# Patient Record
Sex: Female | Born: 1965 | Race: Black or African American | Hispanic: No | Marital: Single | Smoking: Never smoker
Health system: Southern US, Community
[De-identification: ages and names within clinical notes are randomized; demographics above are authoritative.]

## PROBLEM LIST (undated history)

## (undated) DIAGNOSIS — R569 Unspecified convulsions: Secondary | ICD-10-CM

## (undated) DIAGNOSIS — I10 Essential (primary) hypertension: Secondary | ICD-10-CM

## (undated) DIAGNOSIS — F419 Anxiety disorder, unspecified: Secondary | ICD-10-CM

## (undated) DIAGNOSIS — J302 Other seasonal allergic rhinitis: Secondary | ICD-10-CM

---

## 2001-10-12 ENCOUNTER — Emergency Department (HOSPITAL_COMMUNITY): Admission: EM | Admit: 2001-10-12 | Discharge: 2001-10-13 | Payer: Self-pay | Admitting: *Deleted

## 2002-01-01 ENCOUNTER — Ambulatory Visit (HOSPITAL_COMMUNITY): Admission: RE | Admit: 2002-01-01 | Discharge: 2002-01-01 | Payer: Self-pay | Admitting: Family Medicine

## 2002-01-01 ENCOUNTER — Encounter: Payer: Self-pay | Admitting: Family Medicine

## 2002-06-02 ENCOUNTER — Encounter: Payer: Self-pay | Admitting: Family Medicine

## 2002-06-02 ENCOUNTER — Ambulatory Visit (HOSPITAL_COMMUNITY): Admission: RE | Admit: 2002-06-02 | Discharge: 2002-06-02 | Payer: Self-pay | Admitting: Family Medicine

## 2007-06-01 ENCOUNTER — Inpatient Hospital Stay (HOSPITAL_COMMUNITY): Admission: EM | Admit: 2007-06-01 | Discharge: 2007-06-07 | Payer: Self-pay | Admitting: Emergency Medicine

## 2008-04-04 HISTORY — PX: CHOLECYSTECTOMY: SHX55

## 2008-04-13 ENCOUNTER — Ambulatory Visit (HOSPITAL_COMMUNITY): Admission: RE | Admit: 2008-04-13 | Discharge: 2008-04-13 | Payer: Self-pay | Admitting: Family Medicine

## 2008-04-14 ENCOUNTER — Inpatient Hospital Stay (HOSPITAL_COMMUNITY): Admission: AD | Admit: 2008-04-14 | Discharge: 2008-04-16 | Payer: Self-pay | Admitting: Family Medicine

## 2008-04-15 ENCOUNTER — Encounter (INDEPENDENT_AMBULATORY_CARE_PROVIDER_SITE_OTHER): Payer: Self-pay | Admitting: General Surgery

## 2009-04-07 ENCOUNTER — Emergency Department (HOSPITAL_COMMUNITY): Admission: EM | Admit: 2009-04-07 | Discharge: 2009-04-07 | Payer: Self-pay | Admitting: Emergency Medicine

## 2009-04-14 ENCOUNTER — Ambulatory Visit (HOSPITAL_COMMUNITY): Admission: RE | Admit: 2009-04-14 | Discharge: 2009-04-14 | Payer: Self-pay | Admitting: Family Medicine

## 2009-04-18 ENCOUNTER — Ambulatory Visit (HOSPITAL_COMMUNITY): Admission: RE | Admit: 2009-04-18 | Discharge: 2009-04-18 | Payer: Self-pay | Admitting: Family Medicine

## 2010-01-26 ENCOUNTER — Observation Stay (HOSPITAL_COMMUNITY): Admission: AC | Admit: 2010-01-26 | Discharge: 2010-01-27 | Payer: Self-pay | Source: Home / Self Care

## 2010-04-16 LAB — COMPREHENSIVE METABOLIC PANEL
ALT: 10 U/L (ref 0–35)
AST: 17 U/L (ref 0–37)
CO2: 26 meq/L (ref 19–32)
Calcium: 8.8 mg/dL (ref 8.4–10.5)
Creatinine, Ser: 1.06 mg/dL (ref 0.4–1.2)
GFR calc Af Amer: >60 mL/min (ref >60)
GFR calc non Af Amer: 57 mL/min — ABNORMAL LOW (ref >60)
Potassium: 3.5 meq/L (ref 3.5–5.1)
Total Bilirubin: 0.6 mg/dL (ref 0.3–1.2)

## 2010-04-16 LAB — POCT I-STAT, CHEM 8
Calcium, Ion: 0.97 mmol/L — ABNORMAL LOW (ref 1.12–1.32)
Chloride: 104 meq/L (ref 96–112)
Glucose, Bld: 104 mg/dL — ABNORMAL HIGH (ref 70–99)
HCT: 41 % (ref 36.0–46.0)
Potassium: 3.7 meq/L (ref 3.5–5.1)
Sodium: 138 meq/L (ref 135–145)

## 2010-04-16 LAB — CBC
Hemoglobin: 12.6 g/dL (ref 12.0–15.0)
MCHC: 34.1 g/dL (ref 30.0–36.0)
MCV: 87.1 fL (ref 78.0–100.0)
Platelets: 343 10*3/uL (ref 150–400)
Platelets: 370 10*3/uL (ref 150–400)
RBC: 4.56 MIL/uL (ref 3.87–5.11)
RDW: 13.5 % (ref 11.5–15.5)
WBC: 10 10*3/uL (ref 4.0–10.5)
WBC: 14 10*3/uL — ABNORMAL HIGH (ref 4.0–10.5)

## 2010-04-16 LAB — URINE MICROSCOPIC-ADD ON

## 2010-04-16 LAB — URINALYSIS, ROUTINE W REFLEX MICROSCOPIC
Nitrite: NEGATIVE
Protein, ur: NEGATIVE mg/dL

## 2010-04-16 LAB — BASIC METABOLIC PANEL
Calcium: 8.6 mg/dL (ref 8.4–10.5)
GFR calc Af Amer: >60 mL/min (ref >60)
GFR calc non Af Amer: >60 mL/min (ref >60)
Potassium: 3.9 mEq/L (ref 3.5–5.1)

## 2010-04-16 LAB — SAMPLE TO BLOOD BANK

## 2010-04-16 LAB — POCT CARDIAC MARKERS
Myoglobin, poc: 290 ng/mL (ref 12–200)
Troponin i, poc: <0.05 ng/mL (ref 0.00–0.09)

## 2010-04-29 LAB — URINALYSIS, ROUTINE W REFLEX MICROSCOPIC
Bilirubin Urine: NEGATIVE
Glucose, UA: NEGATIVE mg/dL
Hgb urine dipstick: NEGATIVE
Ketones, ur: NEGATIVE mg/dL
Nitrite: NEGATIVE
Protein, ur: NEGATIVE mg/dL
Specific Gravity, Urine: 1.015 (ref 1.005–1.030)
Urobilinogen, UA: 0.2 mg/dL (ref 0.0–1.0)
pH: 6 (ref 5.0–8.0)

## 2010-04-29 LAB — DIFFERENTIAL
Basophils Absolute: 0 10*3/uL (ref 0.0–0.1)
Basophils Relative: 0 % (ref 0–1)
Eosinophils Absolute: 0 10*3/uL (ref 0.0–0.7)
Eosinophils Relative: 0 % (ref 0–5)
Lymphocytes Relative: 22 % (ref 12–46)
Lymphs Abs: 2.2 K/uL (ref 0.7–4.0)
Monocytes Absolute: 0.8 10*3/uL (ref 0.1–1.0)
Monocytes Relative: 8 % (ref 3–12)
Neutro Abs: 6.8 K/uL (ref 1.7–7.7)
Neutrophils Relative %: 69 % (ref 43–77)

## 2010-04-29 LAB — BASIC METABOLIC PANEL
BUN: 9 mg/dL (ref 6–23)
CO2: 32 mEq/L (ref 19–32)
Calcium: 9.3 mg/dL (ref 8.4–10.5)
Chloride: 99 mEq/L (ref 96–112)
Creatinine, Ser: 0.89 mg/dL (ref 0.4–1.2)
GFR calc non Af Amer: >60 mL/min (ref >60)
Glucose, Bld: 106 mg/dL — ABNORMAL HIGH (ref 70–99)
Potassium: 3.2 mEq/L — ABNORMAL LOW (ref 3.5–5.1)
Sodium: 137 mEq/L (ref 135–145)

## 2010-04-29 LAB — RAPID URINE DRUG SCREEN, HOSP PERFORMED
Amphetamines: NOT DETECTED
Barbiturates: NOT DETECTED
Benzodiazepines: NOT DETECTED
Cocaine: NOT DETECTED
Opiates: NOT DETECTED
Tetrahydrocannabinol: NOT DETECTED

## 2010-04-29 LAB — CBC
HCT: 40.2 % (ref 36.0–46.0)
Hemoglobin: 13.9 g/dL (ref 12.0–15.0)
MCHC: 34.7 g/dL (ref 30.0–36.0)
MCV: 90.3 fL (ref 78.0–100.0)
Platelets: 437 K/uL — ABNORMAL HIGH (ref 150–400)
RBC: 4.45 MIL/uL (ref 3.87–5.11)
RDW: 14 % (ref 11.5–15.5)
WBC: 9.9 10*3/uL (ref 4.0–10.5)

## 2010-04-29 LAB — BASIC METABOLIC PANEL WITH GFR: GFR calc Af Amer: >60 mL/min (ref >60)

## 2010-04-29 LAB — PREGNANCY, URINE: Preg Test, Ur: NEGATIVE

## 2010-05-17 LAB — BASIC METABOLIC PANEL
CO2: 27 mEq/L (ref 19–32)
CO2: 27 mEq/L (ref 19–32)
Calcium: 9 mg/dL (ref 8.4–10.5)
Chloride: 107 mEq/L (ref 96–112)
Creatinine, Ser: 1.18 mg/dL (ref 0.4–1.2)
GFR calc Af Amer: >60 mL/min (ref >60)
GFR calc Af Amer: >60 mL/min (ref >60)
GFR calc non Af Amer: 50 mL/min — ABNORMAL LOW (ref >60)
Glucose, Bld: 90 mg/dL (ref 70–99)
Potassium: 3.7 mEq/L (ref 3.5–5.1)
Sodium: 135 mEq/L (ref 135–145)
Sodium: 138 mEq/L (ref 135–145)

## 2010-05-17 LAB — DIFFERENTIAL
Basophils Absolute: 0 10*3/uL (ref 0.0–0.1)
Basophils Relative: 0 % (ref 0–1)
Basophils Relative: 1 % (ref 0–1)
Eosinophils Absolute: 0.1 10*3/uL (ref 0.0–0.7)
Eosinophils Relative: 2 % (ref 0–5)
Lymphocytes Relative: 20 % (ref 12–46)
Monocytes Absolute: 0.5 10*3/uL (ref 0.1–1.0)
Monocytes Absolute: 0.8 10*3/uL (ref 0.1–1.0)
Monocytes Relative: 15 % — ABNORMAL HIGH (ref 3–12)
Monocytes Relative: 9 % (ref 3–12)
Neutro Abs: 2.6 10*3/uL (ref 1.7–7.7)
Neutro Abs: 4.2 10*3/uL (ref 1.7–7.7)
Neutrophils Relative %: 70 % (ref 43–77)

## 2010-05-17 LAB — CBC
HCT: 35.8 % — ABNORMAL LOW (ref 36.0–46.0)
Hemoglobin: 12.4 g/dL (ref 12.0–15.0)
Hemoglobin: 14.1 g/dL (ref 12.0–15.0)
MCHC: 34.5 g/dL (ref 30.0–36.0)
MCHC: 34.6 g/dL (ref 30.0–36.0)
MCV: 89.5 fL (ref 78.0–100.0)
RBC: 4 MIL/uL (ref 3.87–5.11)
RBC: 4.55 MIL/uL (ref 3.87–5.11)
RDW: 14.2 % (ref 11.5–15.5)
RDW: 14.3 % (ref 11.5–15.5)

## 2010-05-17 LAB — HEPATIC FUNCTION PANEL
ALT: 14 U/L (ref 0–35)
AST: 16 U/L (ref 0–37)
Albumin: 3.6 g/dL (ref 3.5–5.2)

## 2010-06-19 NOTE — Op Note (Signed)
NAMEBEREA, MAJKOWSKI               ACCOUNT NO.:  [id number]   MEDICAL RECORD NO.:  [id number]          PATIENT TYPE:  INP   LOCATION:  A319                          FACILITY:  [facility]   PHYSICIAN:  Tilford Pillar, MD      DATE OF BIRTH:  11-22-1965   DATE OF PROCEDURE:  04/15/2008  DATE OF DISCHARGE:                               OPERATIVE REPORT   PREOPERATIVE DIAGNOSES:  1. Acute cholecystitis.  2. Cholelithiasis.   POSTOPERATIVE DIAGNOSES:  1. Acute cholecystitis.  2. Cholelithiasis.   PROCEDURE:  Laparoscopic cholecystectomy.   SURGEON:  Tilford Pillar, MD   ANESTHESIA:  General endotracheal local anesthetic 0.5% Sensorcaine  plain.   SPECIMENS:  Gallbladder.   ESTIMATED BLOOD LOSS:  Minimal.   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  Her workup was consistent with cholelithiasis.  The  risks, benefits, and alternatives of the laparoscopic possible open  cholecystectomy were discussed with the patient including, but not  limited to the risk of bleeding, infection, bile leak, small bowel  injury, common bile duct injury as well as possibility of intraoperative  cardiac and pulmonary events.  Additionally, further etiology of the  epigastric and right upper quadrant abdominal pain were discussed with  the patient and it was instructed to the patient that should she  continue to have symptomatology following cholecystectomy that  additional dyspeptic workup would likely be required.  The patient's  questions and concerns were addressed and the patient consented for the  planned procedure.   OPERATION:  The patient was taken to the operating room, she was placed  in supine position on the operating room table, at which time the  general anesthetic was administered.  Once the patient was asleep, she  was endotracheally intubated by Anesthesia.  At this time, her  abdomen  was prepped and draped with DuraPrep solution and draped in standard  fashion.  A stab incision was created supraumbilically with a #11 blade  scalpel.  Additional dissection down through the subcuticular tissue was  carried out using a Kocher clamp, which was utilized to grasp the  anterior abdominal wall fascia __________ this anteriorly.  A Veress  needle was inserted.  Saline drop test was utilized to confirm  intraperitoneal placement and then pneumoperitoneum was initiated.  Once  the patient's pneumoperitoneum was obtained, an 11-mm trocar was  inserted over the laparoscope allowing visualization of the trocar  entering into the peritoneal cavity.  At this time, the inner cannula  was removed.  The laparoscope was reinserted.  There was no evidence of  any trocar or Veress needle placement injury.  At this time, the  remaining trocars were placed using an 11-mm trocar was placed in the  epigastrium, a 5-mm trocar was placed in the midline between the two 11-  mm trocars and the 5-mm trocars in the right lateral abdominal wall.  The patient was placed in reversed Trendelenburg left lateral decubitus  position.  The fundus of the gallbladder was grasped  and lifted up and  over the right lobe of the liver.  Blunt dissection was carried out,  stripped the peritoneal reflection off the infundibulum, exposing a  cystic duct as it entered into the infundibulum.  A window was created  behind the cystic duct.  Three EndoClips were placed proximally, one  distally, and the cystic duct was divided between the two most distal  clips.  Similarly, the cystic artery was identified.  Two EndoClips were  placed proximally and one distally.  The cystic artery was divided  between the two most distal clips.  At this time, electrocautery was  utilized to dissect the gallbladder free from the gallbladder fossa.  During this dissection, a small cholecystotomy was created, allowing  some bile  spillage.  No stones were noted in the spillage.  This was  quickly controlled.  Once the gallbladder was free, it was placed into  an EndoCatch bag.  At this time, a suction irrigator was brought to the  field.  We copiously irrigated the surgical field.  The returning  aspirate was clear.  At this time, the gallbladder fossa was then  evaluated.  Hemostasis was excellent, having been controlled with  electrocautery.  The EndoClips noted to be in excellent position.  At  this time, attention was turned to closure.   Using an Endoclose suture passing device, a 2-0 Vicryl suture was passed  through both the 11-mm trocar sites.  With the sutures in place, the  gallbladder was removed through the epigastric trocar site.  Some sharp  and blunt dilatation was required to enlarge the trocar site adequately  to remove the gallbladder.  The gallbladder was removed and intact in  EndoCatch bag and was as a permanent specimen to pathology.  At this  time, the pneumoperitoneum was evacuated.  The Vicryl sutures were  secured.  The local anesthetic was instilled.  A 4-0 Monocryl was  utilized to reapproximate the skin edges at all 4 trocar sites.  The  skin was washed, dried with moist and dry towel.  Benzoin was applied  around the incision.  Half-inch Steri-Strips were placed.  The drapes  were removed.  The patient was allowed to come out of general anesthetic  and the patient was transferred back to regular hospital bed.  She was  transferred to the postanesthetic care unit in a stable condition.  At  the conclusion of procedure, all instrument, sponge, and needle counts  were correct.  The patient tolerated the procedure well.      Tilford Pillar, MD  Electronically Signed     BZ/MEDQ  D:  04/15/2008  T:  04/16/2008  Job:  [id number]   cc:   Lorin Picket A. Gerda Diss, MD  Fax: [phone]

## 2010-06-19 NOTE — H&P (Signed)
NAMEKANEISHA, ELLENBERGER               ACCOUNT NO.:  [id number]   MEDICAL RECORD NO.:  [id number]          PATIENT TYPE:  INP   LOCATION:  A310                          FACILITY:  [facility]   PHYSICIAN:  Scott A. Gerda Diss, MD    DATE OF BIRTH:  December 29, 1965   DATE OF ADMISSION:  06/01/2007  DATE OF DISCHARGE:  LH                              HISTORY & PHYSICAL   CHIEF COMPLAINT:  Syncope and cellulitis with abscess.   HISTORY OF PRESENT ILLNESS:  A 45 year old female was seen in the office  with a significant size cellulitis and abscess in the right buttock.  She was treated with doxycycline and Percocet.  On the way out of the  office walking down the hallway, she states she started feeling pain and  a eyewitness states that she just fell over and passed out.  She was  only out for a few seconds.  She landed on her side.  Witness could not  verify whether or not she struck her head.  She was assessed on the  scene by myself and she was alert and oriented when I found her.  She  was slightly clammy, but otherwise she states she felt okay.  She denied  any headache or neck pain.  EMS was summoned.  As a precaution they  placed her on a board with a neck collar and  brought her to the  emergency department.  She denies any prior trouble with this.   PAST MEDICAL HISTORY:  Negative for cardiac problems.  Positive for  hypertension.   PAST SURGICAL HISTORY:  No surgical history.   HABITS:  Non-drinker and non-smoker.   REVIEW OF SYSTEMS:  Negative for headache.  Denies chest pain, shortness  of breath.  No nausea or vomiting or diarrhea.  Denied fever.  Did state  she  had some chills yesterday,  but none today.  Denied any neurologic  symptoms.  No numbness or tingling, no palpitations.   LABORATORY:  Cardiac enzymes were negative.  Sodium 133, potassium 3.5,  creatinine 1.12.  WBC 19.6 with a left shift.   PHYSICAL EXAMINATION:  VITAL SIGNS:  Temperature 98.7 blood pressure  106/60, heart  rate 78.  O2 sat good.  HEENT:  TMs and LT __________.  NECK:  Supple  EXTREMITIES:  Strength normal.  LUNGS:  Clear.  HEART:  Regular.  ABDOMEN:  Soft.  SKIN:  Large buttock abscess noted with cellulitis.  Slight drainage  noted.  A/P cellulitis with abscess.   PLAN:  1. Will treat with vancomycin.  Will culture.  Also blood culture.  2. Will cover for the possibility of MRSA.  This is concerning and      worrisome for the possibility of abscess that would need surgical      drainage.  Will have Dr. Lovell Sheehan here.  3. It is felt that this issue of her passing out was secondary to      vasovagal related to the discomfort of the problem.  It is felt      that it is best for the patient to be  admitted in order to properly      treat this cellulitis, get it drained plus also alleviate some of      pain or alleviate the possibility of a syncope __________ and have      the patient watched in a hospital setting overnight, possibly go      home tomorrow or the next day depending on her progress.  Repeat      CBC in the morning.  Dr. Lovell Sheehan to see this evening.      Scott A. Gerda Diss, MD  Electronically Signed     SAL/MEDQ  D:  06/01/2007  T:  06/02/2007  Job:  [id number]

## 2010-06-19 NOTE — Op Note (Signed)
Valerie Elliott, Valerie Elliott               ACCOUNT NO.:  [id number]   MEDICAL RECORD NO.:  [id number]          PATIENT TYPE:  INP   LOCATION:  A310                          FACILITY:  [facility]   PHYSICIAN:  Dalia Heading, M.D.  DATE OF BIRTH:  11/17/1965   DATE OF PROCEDURE:  06/05/2007  DATE OF DISCHARGE:                               OPERATIVE REPORT   PREOPERATIVE DIAGNOSIS:  Perirectal abscess.   POSTOPERATIVE DIAGNOSIS:  Perirectal abscess.   PROCEDURE:  Incision and drainage of perirectal abscess.   SURGEON:  Dalia Heading, M.D.   ANESTHESIA:  General.   INDICATIONS:  The patient is a 45 year old black female who presents  with a draining perirectal abscess.  Despite local therapy and IV  antibiotics, the patient continues to have induration and drainage.  The  risks and benefits of the procedure were fully explained to the patient,  gave informed consent.   PROCEDURE NOTE:  The patient was placed in the lithotomy position after  general anesthesia was administered.  The perineum was prepped and  draped using the usual sterile technique with Betadine.  Surgical site  confirmation was performed.   A large indurated, draining abscess was noted at the 9 o'clock position  outside the anus.  An incision was made and the purulent fluid and  necrotic debris was evacuated without difficulty.  Curette was used to  get any necrotic soft tissue.  There was no connection to the rectum.  The wound was irrigated with normal saline.  Betadine-impregnated  packing was then placed into the wound.   All tape and needle counts were correct at the end of the procedure.  The patient was awakened and transferred to PACU in stable condition.   COMPLICATIONS:  None.   SPECIMEN:  None.   BLOOD LOSS:  Minimal.      Dalia Heading, M.D.  Electronically Signed     MAJ/MEDQ  D:  06/05/2007  T:  06/06/2007  Job:  [id number]   cc:   Lorin Picket A. Gerda Diss, MD  Fax: [phone]

## 2010-06-19 NOTE — H&P (Signed)
NAMEPETER, Valerie Elliott               ACCOUNT NO.:  [id number]   MEDICAL RECORD NO.:  [id number]          PATIENT TYPE:  INP   LOCATION:  A319                          FACILITY:  [facility]   PHYSICIAN:  Scott A. Gerda Diss, MD    DATE OF BIRTH:  1965/10/31   DATE OF ADMISSION:  04/14/2008  DATE OF DISCHARGE:  LH                              HISTORY & PHYSICAL   CHIEF COMPLAINT:  Abdominal pain, vomiting, diarrhea.   HISTORY OF PRESENT ILLNESS:  This is a 45 year old female who has had  approximately about 7-10 days discomfort in the right upper quadrant and  radiating to her side.  Some nausea with the pain comes and goes,  sometimes associated with food, sometimes not, and sometimes made to  feel quite nauseous.  The patient was recently seen in the office.  Lab  work and ultrasound were ordered and lab work came back looking good.  All CBC, met-7, liver profile, and lipase.  Ultrasound on March 10  showed gallstones without any obvious acute inflammation of gallbladder.  On the night of March 10 she started having intense pain in the right  upper quadrant along with several episodes of vomiting and also some  diarrhea.  She denied any bloody stool, denied dysuria. Urinary  frequency benign, fever, chills.  She came to the office today.  She  felt very bad and weak and felt somewhat dizzy when she tried to get up  __________.   PAST MEDICAL HISTORY:  HPN.   FAMILY HISTORY:  Diabetes __________ none..   SOCIAL HISTORY:  Does not smoke or drink.  Divorced.   MEDICATIONS:  Home medications:  Zestril, __________ daily, Hyzaar 1.25  mg, Vicodin p.r.n., Restoril p.r.n.,   REVIEW OF SYSTEMS:  See above.  HEENT:  __________ as above.  Mucous  membranes a little bit dry.  LUNGS:  Clear.  HEART:  Tachycardic.  __________.  ABDOMEN:  Soft with right upper quadrant tenderness.  No true guarding.  EXTREMITIES:  No peripheral edema.   LABORATORY DATA:  Met-7:  Potassium 3.8, sodium was good,  glucose 111,  creatinine 1.18.  Liver functions looked good.  White count normal.  Hemoglobin good.   Ultrasound today shows cholelithiasis with abdominal pain__________,  acute gastroenteritis with IV fluids.  Continue her pain medication.  After talking with the patient she states the last time she was in pain  management it made her itch.  She thinks it was Dilaudid.  We will try  to get her old records to look at them but in the meantime  she needs morphine if necessary.  In addition to this we will consult  Dr. Leticia Penna for the right upper quadrant tenderness at this time and  gallbladder tenderness secondary to gallstones.  He may want to obtain  __________ either now  or __________.  Surgical __________.      Scott A. Gerda Diss, MD  Electronically Signed     SAL/MEDQ  D:  04/14/2008  T:  04/15/2008  Job:  [id number]

## 2010-06-22 NOTE — Discharge Summary (Signed)
Valerie Elliott, Valerie Elliott               ACCOUNT NO.:  [id number]   MEDICAL RECORD NO.:  [id number]           PATIENT TYPE:   LOCATION:                                 FACILITY:   PHYSICIAN:  Scott A. Gerda Diss, MD    DATE OF BIRTH:  04/16/1960   DATE OF ADMISSION:  06/01/2007  DATE OF DISCHARGE:  05/03/2009LH                               DISCHARGE SUMMARY   DISCHARGE DIAGNOSES:  Severe cellulitis along with abscess, along with  syncope, and hypertension.   HOSPITAL COURSE:  This patient admitted with severe cellulitis of the  buttock, pain, discomfort, and swelling.  She was seen in the office,  was treated.  On leaving the office, she got very faint and passed out.  She has had significant pain and discomfort ever since.  She has had  progressive discomfort.  She was treated with sitz baths.  She was also  monitored closely on a daily basis, and this area finally developed into  an abscess, and Dr. Lovell Sheehan went ahead and took her to surgery and did a  drainage of this area.  When she came in, her white count was 19,000,  and by June 03, 2007, it came down to 11,000.  She also had low  potassium one day that got corrected and brought back up.  Wound culture  showed Staphylococcus aureus, and she was treated appropriately with  vancomycin.  She was sent home on doxycycline twice daily for 2 weeks,  and follow up with Dr. Lovell Sheehan within the next 2 weeks, and follow up  with [place] within the next 2 weeks.      Scott A. Gerda Diss, MD  Electronically Signed     SAL/MEDQ  D:  06/30/2007  T:  06/30/2007  Job:  [id number]

## 2010-06-22 NOTE — Discharge Summary (Signed)
[name], Valerie Elliott               ACCOUNT NO.:  [id number]   MEDICAL RECORD NO.:  [id number]          PATIENT TYPE:  INP   LOCATION:  A319                          FACILITY:  [facility]   PHYSICIAN:  Tilford Pillar, MD      DATE OF BIRTH:  1965/08/09   DATE OF ADMISSION:  04/14/2008  DATE OF DISCHARGE:  03/13/2010LH                               DISCHARGE SUMMARY   ADMISSION DIAGNOSIS:  Acute cholecystitis.   DISCHARGE DIAGNOSES:  Acute cholecystitis and hypertension.   PROCEDURE:  Laparoscopic cholecystectomy.   DISPOSITION:  Home.   BRIEF HISTORY AND PHYSICAL:  Please see the admission history and  physical for complete H and P.  The patient is a 45 year old female who  presented to [facility] with right upper quadrant abdominal  pain.  She was evaluated and worked up by Dr. Gerda Diss, at which time she  discovered that the patient to have cholelithiasis and biliary  symptomatology.  She was admitted for continued management and  intervention.   HOSPITAL COURSE:  The patient was admitted by Dr. Gerda Diss on April 14, 2008.  She was evaluated and was again diagnosed with an acute  cholecystitis, cholelithiasis.  She was taken to the operating room on  April 15, 2008, for laparoscopic cholecystectomy which she tolerated  extremely well.  She spent a brief period in the postanesthesia care  unit, was transferred back to a regular hospital floor and then was  allowed to advance back on her diet.  She was tolerating regular diet,  was ambulatory, and pain was controlled on more pain medications.  The  patient was discharged home on April 16, 2008.   DISCHARGE INSTRUCTIONS:  The patient was instructed to increase activity  as tolerated.  She may walk up stairs.  She may ambulate.  She is not to  lift anything greater than 20 pounds for the next 3 weeks.  She was  instructed to continue to clean her incisions with soap and water.  She  has no restriction on diet.  She is to return to  see me in followup in 3  weeks.  She is not to drive while taking pain medications.   DISCHARGE MEDICATIONS:  1. Lortab 5/500 one to two p.o. q.4 hours p.r.n. pain.  2. Ibuprofen 400 mg p.o. q.6 hours p.r.n. pain.      Tilford Pillar, MD  Electronically Signed    BZ/MEDQ  D:  05/18/2008  T:  05/19/2008  Job:  [id number]

## 2010-10-30 LAB — BASIC METABOLIC PANEL
BUN: 11
CO2: 28
CO2: 29
Calcium: 8.4
Chloride: 96
Creatinine, Ser: 1.12
GFR calc Af Amer: >60
GFR calc non Af Amer: 54 — ABNORMAL LOW
Glucose, Bld: 110 — ABNORMAL HIGH
Glucose, Bld: 112 — ABNORMAL HIGH
Potassium: 3.5
Sodium: 133 — ABNORMAL LOW
Sodium: 138

## 2010-10-30 LAB — CULTURE, BLOOD (ROUTINE X 2): Report Status: 5022009

## 2010-10-30 LAB — CBC
HCT: 38
Hemoglobin: 10.9 — ABNORMAL LOW
Hemoglobin: 12.1
MCHC: 35.1
MCHC: 35.4
MCV: 87.6
MCV: 87.8
Platelets: 411 — ABNORMAL HIGH
RBC: 3.9
RDW: 13.8
RDW: 14.1

## 2010-10-30 LAB — DIFFERENTIAL
Basophils Absolute: 0.1
Basophils Absolute: 0.1
Basophils Relative: 1
Basophils Relative: 1
Eosinophils Absolute: 0
Eosinophils Absolute: 0.1
Eosinophils Absolute: 0.1
Eosinophils Relative: 0
Eosinophils Relative: 1
Lymphocytes Relative: 8 — ABNORMAL LOW
Monocytes Absolute: 1.3 — ABNORMAL HIGH
Monocytes Absolute: 1.5 — ABNORMAL HIGH
Monocytes Relative: 10
Monocytes Relative: 8
Neutro Abs: 11.1 — ABNORMAL HIGH
Neutro Abs: 16.5 — ABNORMAL HIGH
Neutro Abs: 7.7
Neutrophils Relative %: 84 — ABNORMAL HIGH

## 2010-10-30 LAB — WOUND CULTURE

## 2010-10-30 LAB — POCT CARDIAC MARKERS
Operator id: 208401
Troponin i, poc: 0.06 — ABNORMAL HIGH

## 2010-10-30 LAB — POTASSIUM: Potassium: 3.8

## 2012-11-12 ENCOUNTER — Other Ambulatory Visit: Payer: Self-pay | Admitting: Family Medicine

## 2013-02-15 ENCOUNTER — Ambulatory Visit (INDEPENDENT_AMBULATORY_CARE_PROVIDER_SITE_OTHER): Payer: 59 | Admitting: Family Medicine

## 2013-02-15 ENCOUNTER — Encounter: Payer: Self-pay | Admitting: Family Medicine

## 2013-02-15 VITALS — BP 128/80 | Temp 99.3°F | Ht 70.0 in | Wt 232.0 lb

## 2013-02-15 DIAGNOSIS — J111 Influenza due to unidentified influenza virus with other respiratory manifestations: Secondary | ICD-10-CM

## 2013-02-15 DIAGNOSIS — J019 Acute sinusitis, unspecified: Secondary | ICD-10-CM

## 2013-02-15 MED ORDER — HYDROCODONE-ACETAMINOPHEN 5-325 MG PO TABS: 1.0000 | ORAL_TABLET | Freq: Four times a day (QID) | ORAL | Status: DC | PRN

## 2013-02-15 MED ORDER — SULFAMETHOXAZOLE-TMP DS 800-160 MG PO TABS
1.0000 | ORAL_TABLET | Freq: Two times a day (BID) | ORAL | Status: DC
Start: 2013-02-15 — End: 2013-03-08

## 2013-02-15 NOTE — Progress Notes (Signed)
   Subjective:    Patient ID: Valerie Elliott, female    DOB: 1965/06/20, 48 y.o.   MRN: [id number]  Fever  This is a new problem. The current episode started in the past 7 days. Her temperature was unmeasured prior to arrival. Associated symptoms include congestion, coughing, headaches and muscle aches. Pertinent negatives include no chest pain, ear pain or wheezing. She has tried NSAIDs (Mucinex, Nyquil) for the symptoms. The treatment provided mild relief.   PMH benign, HTN   Review of Systems  Constitutional: Positive for fever. Negative for activity change.  HENT: Positive for congestion and rhinorrhea. Negative for ear pain.   Eyes: Negative for discharge.  Respiratory: Positive for cough. Negative for shortness of breath and wheezing.   Cardiovascular: Negative for chest pain.  Neurological: Positive for headaches.       Objective:   Physical Exam  Nursing note and vitals reviewed. Constitutional: She appears well-developed.  HENT:  Head: Normocephalic.  Nose: Nose normal.  Mouth/Throat: Oropharynx is clear and moist. No oropharyngeal exudate.  Neck: Neck supple.  Cardiovascular: Normal rate and normal heart sounds.   No murmur heard. Pulmonary/Chest: Effort normal and breath sounds normal. She has no wheezes.  Lymphadenopathy:    She has no cervical adenopathy.  Skin: Skin is warm and dry.    She does not appear toxic but she does look like she feels bad. She is able to relate freely. Moderate sinus tenderness on percussion. Lungs were crystal clear.      Assessment & Plan:  Influenza with secondary sinusitis. At this stage in her illness Tamiflu would not have benefit. Influenza-the patient was diagnosed with influenza. Patient/family educated about the flu and warning signs to watch for. If difficulty breathing, severe neck pain and stiffness, cyanosis, disorientation, or progressive worsening then immediately get rechecked at that ER. If progressive symptoms be  certain to be rechecked. Supportive measures such as Tylenol/ibuprofen was discussed. No aspirin use in children. And influenza home care instruction sheet was given.

## 2013-03-08 ENCOUNTER — Encounter: Payer: Self-pay | Admitting: Family Medicine

## 2013-03-08 ENCOUNTER — Ambulatory Visit (INDEPENDENT_AMBULATORY_CARE_PROVIDER_SITE_OTHER): Payer: 59 | Admitting: Family Medicine

## 2013-03-08 ENCOUNTER — Telehealth: Payer: Self-pay | Admitting: Family Medicine

## 2013-03-08 VITALS — BP 122/70 | Temp 98.7°F | Ht 70.0 in | Wt 236.0 lb

## 2013-03-08 DIAGNOSIS — J040 Acute laryngitis: Secondary | ICD-10-CM

## 2013-03-08 DIAGNOSIS — J019 Acute sinusitis, unspecified: Secondary | ICD-10-CM

## 2013-03-08 DIAGNOSIS — J069 Acute upper respiratory infection, unspecified: Secondary | ICD-10-CM

## 2013-03-08 MED ORDER — AZITHROMYCIN 250 MG PO TABS: ORAL_TABLET | ORAL | Status: DC

## 2013-03-08 MED ORDER — BENZONATATE 100 MG PO CAPS: 100.0000 mg | ORAL_CAPSULE | Freq: Four times a day (QID) | ORAL | Status: DC | PRN

## 2013-03-08 MED ORDER — HYDROCODONE-HOMATROPINE 5-1.5 MG/5ML PO SYRP: 5.0000 mL | ORAL_SOLUTION | Freq: Three times a day (TID) | ORAL | Status: DC | PRN

## 2013-03-08 NOTE — Progress Notes (Signed)
   Subjective:    Patient ID: Valerie Elliott, female    DOB: 1965-11-05, 48 y.o.   MRN: [id number]  Cough This is a new problem. The current episode started yesterday. Associated symptoms include headaches, myalgias, rhinorrhea, a sore throat and wheezing. Pertinent negatives include no chest pain, ear pain, fever or shortness of breath.   PMH benign   Review of Systems  Constitutional: Negative for fever and activity change.  HENT: Positive for congestion, rhinorrhea and sore throat. Negative for ear pain.   Eyes: Negative for discharge.  Respiratory: Positive for cough and wheezing. Negative for shortness of breath.   Cardiovascular: Negative for chest pain.  Musculoskeletal: Positive for myalgias.  Neurological: Positive for headaches.       Objective:   Physical Exam  Nursing note and vitals reviewed. Constitutional: She appears well-developed.  HENT:  Head: Normocephalic.  Nose: Nose normal.  Mouth/Throat: Oropharynx is clear and moist. No oropharyngeal exudate.  Neck: Neck supple.  Cardiovascular: Normal rate and normal heart sounds.   No murmur heard. Pulmonary/Chest: Effort normal and breath sounds normal. She has no wheezes.  Lymphadenopathy:    She has no cervical adenopathy.  Skin: Skin is warm and dry.      Hoarseness is noted    Assessment & Plan:  Upper rest for illness/sinusitis/viral syndrome-Z-Pak as directed Hycodan at nighttime as needed for cough, cautioned drowsiness, Tessalon when necessary should gradually get better next few days

## 2013-03-08 NOTE — Telephone Encounter (Signed)
Patient is still having slight fever, extreme coughing, voice hoarse, chest pain   [facility]

## 2013-03-08 NOTE — Telephone Encounter (Signed)
Patient notified and transferred up front to make an appt for today

## 2013-03-08 NOTE — Telephone Encounter (Signed)
Spoke with patient. She said she is still coughing, has sinus drainage, hoarse voice, chills/sweats (but did not take temp.), and states she is wheezing. She was seen here on 1/12 for flu-like symptoms and was given Bactrim and Norco.

## 2013-03-08 NOTE — Telephone Encounter (Signed)
I believe this patient needs to be seen again. There is no fantastic answers right at the moment but it is better to be safe. We need to exclude pneumonia. She needs her lungs listened to. She will more than likely need antibiotics and potentially other measures. Needs office visit today

## 2013-03-21 ENCOUNTER — Other Ambulatory Visit: Payer: Self-pay | Admitting: Family Medicine

## 2013-05-10 ENCOUNTER — Encounter: Payer: Self-pay | Admitting: Family Medicine

## 2013-05-10 ENCOUNTER — Ambulatory Visit (INDEPENDENT_AMBULATORY_CARE_PROVIDER_SITE_OTHER): Payer: 59 | Admitting: Family Medicine

## 2013-05-10 VITALS — BP 118/86 | Ht 70.0 in | Wt 223.0 lb

## 2013-05-10 DIAGNOSIS — M5412 Radiculopathy, cervical region: Secondary | ICD-10-CM

## 2013-05-10 DIAGNOSIS — M5010 Cervical disc disorder with radiculopathy, unspecified cervical region: Secondary | ICD-10-CM

## 2013-05-10 MED ORDER — HYDROCODONE-ACETAMINOPHEN 7.5-325 MG PO TABS: 1.0000 | ORAL_TABLET | ORAL | Status: DC | PRN

## 2013-05-10 MED ORDER — CYCLOBENZAPRINE HCL 10 MG PO TABS: 10.0000 mg | ORAL_TABLET | Freq: Every day | ORAL | Status: DC

## 2013-05-10 MED ORDER — PREDNISONE 20 MG PO TABS: ORAL_TABLET | ORAL | Status: AC

## 2013-05-10 NOTE — Progress Notes (Signed)
   Subjective:    Patient ID: Valerie Elliott, female    DOB: 1965/05/24, 48 y.o.   MRN: [id number]  Shoulder Pain  The pain is present in the left shoulder and left elbow. This is a new problem. Episode onset: Last week. There has been no history of extremity trauma. The problem occurs constantly. The quality of the pain is described as sharp. Associated symptoms comments: Patient under a lot of stress . The symptoms are aggravated by lying down. She has tried NSAIDS for the symptoms. The treatment provided no relief.   She relates she had a problem in the past with a bulging disc. She describes significant pain discomfort from the neck into the trapezius radiates down the arm into the elbow.   Review of Systems Denies numbness does relate pain. Denies chest tightness pressure or   shortness of breath Objective:   Physical Exam Tenderness in the left trapezius. Lungs are clear hearts regular strength in the arm is normal. Shoulder normal. Reflexes good. Pain goes from the trapezius down into the elbow. She rates as a 10 over 10.       Assessment & Plan:  Cervical radiculopathy-prednisone taper, pain medication cautioned drowsiness, muscle relaxer at nighttime, if not doing better over the course of the next 10 days may need MRI of the neck. Patient to let us know. If weakness or other problems followup sooner.

## 2013-05-24 ENCOUNTER — Telehealth: Payer: Self-pay | Admitting: Family Medicine

## 2013-05-24 MED ORDER — TRAMADOL HCL 50 MG PO TABS: 50.0000 mg | ORAL_TABLET | Freq: Three times a day (TID) | ORAL | Status: DC | PRN

## 2013-05-24 NOTE — Telephone Encounter (Signed)
Tramadol 50 mg, #30, 1 tid prn 1 refill

## 2013-05-24 NOTE — Addendum Note (Signed)
Addended by: Carmelina Noun on: 05/24/2013 04:38 PM   Modules accepted: Orders

## 2013-05-24 NOTE — Telephone Encounter (Signed)
[facility] 05/24/13

## 2013-05-24 NOTE — Telephone Encounter (Signed)
We can either go with all TRAM or oxycodone. The advantage of all TRAM is she could still function fairly well with it and we could fax to them. Discussed with the patient find out which pain medicine she would like to try and

## 2013-05-24 NOTE — Telephone Encounter (Signed)
Seen on 05/10/13. Prescribed hydrocodone for arm pain. Arm is still hurting. Stopped hydrocodone because it caused blisters on her tongue. Blisters are gone but tongue is still sore. Also caused burning in her chest. Not having any burning now but would like to see if she can get something different for her arm pain. [facility] [place]

## 2013-05-24 NOTE — Telephone Encounter (Signed)
Patient said that she was recently seen for arm pain and prescribed Hydrocortisone? She says she had a reaction to this medicine and it gave her blisters on her tongue and an acid reflux feeling in her chest. Please advise. [place]

## 2013-05-24 NOTE — Telephone Encounter (Signed)
Med faxed to pharm. Pt notified.  

## 2013-05-24 NOTE — Telephone Encounter (Signed)
Pt would like tramadol faxed to [place]. Pt does not need a call back.

## 2013-05-27 ENCOUNTER — Telehealth: Payer: Self-pay | Admitting: Family Medicine

## 2013-05-27 DIAGNOSIS — M79603 Pain in arm, unspecified: Secondary | ICD-10-CM

## 2013-05-27 DIAGNOSIS — M5410 Radiculopathy, site unspecified: Secondary | ICD-10-CM

## 2013-05-27 NOTE — Telephone Encounter (Signed)
Patient still in pain.She was seen 4/6 with shoulder and elbow pain. She wants MRI setup if possible on a Friday morning.

## 2013-05-27 NOTE — Telephone Encounter (Signed)
MRI of cervical spine do to severe radiculopathy and pain into the right arm. Set up for Friday morning if possible.

## 2013-05-27 NOTE — Telephone Encounter (Signed)
MRI scheduled [place] May 1st 8am register 7:45. Pt notified.

## 2013-05-27 NOTE — Addendum Note (Signed)
Addended by: Carmelina Noun on: 05/27/2013 02:33 PM   Modules accepted: Orders

## 2013-06-04 ENCOUNTER — Ambulatory Visit (HOSPITAL_COMMUNITY): Admission: RE | Admit: 2013-06-04 | Payer: 59 | Source: Ambulatory Visit

## 2013-06-04 ENCOUNTER — Telehealth: Payer: Self-pay | Admitting: Family Medicine

## 2013-06-04 NOTE — Telephone Encounter (Signed)
Pt was supposed to have an MRI this AM but had a very difficult time  And had to be taken out with in two minutes.   Pt wants to know if there is another scan she can take, she would rather not Try to do another MRI it is just to much for her, made her very upset to the point of  Crying when I took this note.   Please advise pt as soon as you can as to what she should do or give her a call to  Discuss the other option.

## 2013-06-04 NOTE — Telephone Encounter (Signed)
Discussed with patient that we could order open MRI and she would have to go to [facility]. Patient wants to know if there is any other alternatives.

## 2013-06-06 NOTE — Telephone Encounter (Signed)
Please talk with the patient. There are not any great options. One option would be I could set her up with a neurologist but may more than likely if she is having arm pain in radiation up into the neck we will want to do an MRI. I would be happy to see her and sit down with her or I can refer her to a specialist. The other option is taking nerve medication and do open MRI. Given that this patient is anxious and upset it may be best if we just see her back reexamine her and talk about all of the options face-to-face I would be happy to see her this week preferably Monday Tuesday or Wednesday

## 2013-06-07 NOTE — Telephone Encounter (Signed)
Discussed with patient and transferred to front desk to schedule appointment.

## 2013-06-08 ENCOUNTER — Encounter: Payer: Self-pay | Admitting: Family Medicine

## 2013-06-08 ENCOUNTER — Ambulatory Visit (INDEPENDENT_AMBULATORY_CARE_PROVIDER_SITE_OTHER): Payer: 59 | Admitting: Family Medicine

## 2013-06-08 VITALS — BP 138/86 | Ht 70.0 in | Wt 213.0 lb

## 2013-06-08 DIAGNOSIS — M5010 Cervical disc disorder with radiculopathy, unspecified cervical region: Secondary | ICD-10-CM | POA: Insufficient documentation

## 2013-06-08 DIAGNOSIS — M5412 Radiculopathy, cervical region: Secondary | ICD-10-CM

## 2013-06-08 MED ORDER — LORAZEPAM 1 MG PO TABS: 1.0000 mg | ORAL_TABLET | Freq: Two times a day (BID) | ORAL | Status: DC | PRN

## 2013-06-08 MED ORDER — OXYCODONE-ACETAMINOPHEN 7.5-325 MG PO TABS: 1.0000 | ORAL_TABLET | ORAL | Status: DC | PRN

## 2013-06-08 NOTE — Progress Notes (Signed)
   Subjective:    Patient ID: Valerie Elliott, female    DOB: 1965/10/13, 48 y.o.   MRN: [id number]  HPI Patient was seen here on 4/6 for left side shoulder pain. It radiates to her arm, down to her fingers.  She was supposed to get an MRI on May 1st, but had an anxiety attack.  She would like to discuss other alternatives. Long discussion held with patient no matter what specialist I would get involved they would want an MRI. Patient was advised not to just hang in there with the pain instead she was advised that she needs to get this checked into she agrees. 15 minutes spent with patient. Greater than half in discussion.    Review of Systems Patient relates pain in the neck down the arm keeps her up at night severe denies chest tightness pressure pain shortness breath    Objective:   Physical Exam  Significant pain discomfort in the left arm radiates into pediatrics trapezius down the arm strength is slightly diminished patient relates pain 8/10 unable to do significant movement a cause of the pain. This is classic cervical radiculopathy.      Assessment & Plan:  This patient needs urgent MRI she did not tolerate the closed machine we will do open MRI

## 2013-06-11 ENCOUNTER — Telehealth: Payer: Self-pay

## 2013-06-11 ENCOUNTER — Other Ambulatory Visit: Payer: Self-pay

## 2013-06-11 DIAGNOSIS — M5412 Radiculopathy, cervical region: Secondary | ICD-10-CM

## 2013-06-11 NOTE — Telephone Encounter (Signed)
Yes precert is required, no need for new one, used auth # for test pt was unable to have done, faxed approval # to [facility]

## 2013-06-11 NOTE — Telephone Encounter (Signed)
Open MRI C-spine was scheduled for today at 2:30 pm at [place]. Does it need a pre-cert? Patient is aware of appointment.

## 2013-06-18 ENCOUNTER — Telehealth: Payer: Self-pay | Admitting: Family Medicine

## 2013-06-18 NOTE — Telephone Encounter (Signed)
Patient calling to find out results to MRI.

## 2013-06-18 NOTE — Telephone Encounter (Signed)
It appears she got this done on 5/8: Open MRI C-spine was scheduled for today at 2:30 pm at [facility]

## 2013-06-21 NOTE — Telephone Encounter (Signed)
Results on front of chart in message pile

## 2013-06-21 NOTE — Telephone Encounter (Signed)
This patient has significant spinal stenosis aware nerves exit the neck. She also has what appears to be some small disc herniations as well. I would recommend that we set her up with a specialist. (Options include [facility] orthopedic specialist, Dr. Carloyn Manner in [place], or neurosurgical surgical services and [place]. Please find out from the patient if she has a preference) she may indent needing to have injections, possible surgery

## 2013-06-21 NOTE — Telephone Encounter (Signed)
Try to have [facility] fax results (plz)

## 2013-06-22 ENCOUNTER — Telehealth: Payer: Self-pay | Admitting: Family Medicine

## 2013-06-22 ENCOUNTER — Other Ambulatory Visit: Payer: Self-pay

## 2013-06-22 DIAGNOSIS — M4802 Spinal stenosis, cervical region: Secondary | ICD-10-CM

## 2013-06-22 MED ORDER — OXYCODONE-ACETAMINOPHEN 7.5-325 MG PO TABS: 1.0000 | ORAL_TABLET | ORAL | Status: DC | PRN

## 2013-06-22 NOTE — Telephone Encounter (Signed)
Last seen 5/5//15

## 2013-06-22 NOTE — Telephone Encounter (Signed)
Patient notified script ready for pick up

## 2013-06-22 NOTE — Telephone Encounter (Signed)
Notified patient she has significant spinal stenosis aware nerves exit the neck. She also has what appears to be some small disc herniations as well. Recommend that we set her up with a specialist. (Options include [facility] orthopedic specialist, Dr. Carloyn Manner in [place], or neurosurgical surgical services and [place]. Patient states she wants to see Dr. Carloyn Manner. Referral initiated in the system.

## 2013-06-22 NOTE — Telephone Encounter (Signed)
Patient needs Rx for oxycodone 7.5 mg

## 2013-06-22 NOTE — Telephone Encounter (Signed)
May refill pain med

## 2013-07-05 ENCOUNTER — Other Ambulatory Visit: Payer: Self-pay | Admitting: Family Medicine

## 2013-07-19 ENCOUNTER — Other Ambulatory Visit: Payer: Self-pay | Admitting: Family Medicine

## 2013-07-19 NOTE — Telephone Encounter (Signed)
Valerie Elliott for this refill and 2 additional rfills

## 2013-08-25 ENCOUNTER — Other Ambulatory Visit: Payer: Self-pay | Admitting: Family Medicine

## 2013-10-11 ENCOUNTER — Other Ambulatory Visit: Payer: Self-pay | Admitting: Family Medicine

## 2013-10-13 ENCOUNTER — Other Ambulatory Visit: Payer: Self-pay | Admitting: *Deleted

## 2013-10-13 MED ORDER — INDAPAMIDE 1.25 MG PO TABS: ORAL_TABLET | ORAL | Status: DC

## 2013-10-13 MED ORDER — LISINOPRIL 5 MG PO TABS: ORAL_TABLET | ORAL | Status: DC

## 2013-11-19 ENCOUNTER — Other Ambulatory Visit: Payer: Self-pay | Admitting: Family Medicine

## 2013-11-19 NOTE — Telephone Encounter (Signed)
One mo of each chronic ov with scott before finished

## 2013-12-23 ENCOUNTER — Other Ambulatory Visit: Payer: Self-pay | Admitting: Family Medicine

## 2014-01-24 ENCOUNTER — Other Ambulatory Visit: Payer: Self-pay | Admitting: Family Medicine

## 2014-01-31 ENCOUNTER — Ambulatory Visit (INDEPENDENT_AMBULATORY_CARE_PROVIDER_SITE_OTHER): Payer: 59 | Admitting: Family Medicine

## 2014-01-31 ENCOUNTER — Encounter: Payer: Self-pay | Admitting: Family Medicine

## 2014-01-31 VITALS — BP 128/74 | Temp 99.0°F | Ht 70.0 in | Wt 232.0 lb

## 2014-01-31 DIAGNOSIS — I10 Essential (primary) hypertension: Secondary | ICD-10-CM | POA: Insufficient documentation

## 2014-01-31 DIAGNOSIS — L02412 Cutaneous abscess of left axilla: Secondary | ICD-10-CM

## 2014-01-31 DIAGNOSIS — Z13220 Encounter for screening for lipoid disorders: Secondary | ICD-10-CM

## 2014-01-31 MED ORDER — LORAZEPAM 1 MG PO TABS: 1.0000 mg | ORAL_TABLET | Freq: Two times a day (BID) | ORAL | Status: DC | PRN

## 2014-01-31 MED ORDER — LISINOPRIL 5 MG PO TABS: ORAL_TABLET | ORAL | Status: DC

## 2014-01-31 MED ORDER — INDAPAMIDE 1.25 MG PO TABS: ORAL_TABLET | ORAL | Status: DC

## 2014-01-31 MED ORDER — CYCLOBENZAPRINE HCL 10 MG PO TABS: 10.0000 mg | ORAL_TABLET | Freq: Every evening | ORAL | Status: DC | PRN

## 2014-01-31 MED ORDER — OXYCODONE-ACETAMINOPHEN 5-325 MG PO TABS: 1.0000 | ORAL_TABLET | Freq: Three times a day (TID) | ORAL | Status: DC | PRN

## 2014-01-31 MED ORDER — DOXYCYCLINE HYCLATE 100 MG PO CAPS: 100.0000 mg | ORAL_CAPSULE | Freq: Two times a day (BID) | ORAL | Status: DC

## 2014-01-31 MED ORDER — LEVETIRACETAM 500 MG PO TABS: 500.0000 mg | ORAL_TABLET | Freq: Two times a day (BID) | ORAL | Status: DC

## 2014-01-31 NOTE — Patient Instructions (Signed)
Take doxycycline with food and 8 oz. water twice daily for 10 days  See surgeon  Do your lab work

## 2014-01-31 NOTE — Progress Notes (Signed)
   Subjective:    Patient ID: Valerie Elliott, female    DOB: 01/22/1966, 48 y.o.   MRN: [id number]  HPIpainful bump under left arm. noticied it on christmas day.  Doing warm compresses and taking advil.   Pt is requesting refills on all her meds. Last check up was may 2015. Pt advised that she needs office visit for a check up. Requesting refills on flexeril, ativan, keppra, lisinopril, and indapamide.     Review of Systems     Objective:   Physical Exam  Lungs clear heart regular Large boil noted underneath the left arm. Somewhat fluctuant. Also has areas of tenderness around.      Assessment & Plan:  Warm compresses frequently every couple hours Antibiotics prescribed Pain medication prescribed Referral to general surgeon for I&D.  Recommend standard follow-up office visit within the next 1-2 months for her other health issues lab work ordered  Patient somewhat blue because of her brother dying at age 69 of lung cancer she denies being depressed

## 2014-02-09 LAB — LIPID PANEL
CHOL/HDL RATIO: 5 ratio
CHOLESTEROL: 195 mg/dL (ref 0–200)
HDL: 39 mg/dL — AB (ref >39)
LDL CALC: 135 mg/dL — AB (ref 0–99)
Triglycerides: 105 mg/dL (ref <150)
VLDL: 21 mg/dL (ref 0–40)

## 2014-02-10 ENCOUNTER — Encounter: Payer: Self-pay | Admitting: Family Medicine

## 2014-02-24 ENCOUNTER — Other Ambulatory Visit: Payer: Self-pay | Admitting: *Deleted

## 2014-02-24 ENCOUNTER — Telehealth: Payer: Self-pay | Admitting: Family Medicine

## 2014-02-24 MED ORDER — LORAZEPAM 1 MG PO TABS: 1.0000 mg | ORAL_TABLET | Freq: Two times a day (BID) | ORAL | Status: DC | PRN

## 2014-02-24 MED ORDER — LEVETIRACETAM 500 MG PO TABS: 500.0000 mg | ORAL_TABLET | Freq: Two times a day (BID) | ORAL | Status: DC

## 2014-02-24 MED ORDER — CYCLOBENZAPRINE HCL 10 MG PO TABS: 10.0000 mg | ORAL_TABLET | Freq: Every evening | ORAL | Status: DC | PRN

## 2014-02-24 NOTE — Telephone Encounter (Signed)
Last seen 12/28

## 2014-02-24 NOTE — Telephone Encounter (Signed)
Pt is needing refills on her ativan, flexaril, and keppra. Pt is almost out. Pt has an appt scheduled for Tues at 8:30.

## 2014-02-24 NOTE — Telephone Encounter (Signed)
meds sent to pharm. Pt notified.  

## 2014-02-24 NOTE — Telephone Encounter (Signed)
May refill all those times one

## 2014-03-01 ENCOUNTER — Ambulatory Visit (INDEPENDENT_AMBULATORY_CARE_PROVIDER_SITE_OTHER): Payer: 59 | Admitting: Family Medicine

## 2014-03-01 ENCOUNTER — Encounter: Payer: Self-pay | Admitting: Family Medicine

## 2014-03-01 VITALS — BP 122/86 | Ht 70.0 in | Wt 238.0 lb

## 2014-03-01 DIAGNOSIS — M5010 Cervical disc disorder with radiculopathy, unspecified cervical region: Secondary | ICD-10-CM

## 2014-03-01 DIAGNOSIS — I10 Essential (primary) hypertension: Secondary | ICD-10-CM

## 2014-03-01 MED ORDER — LEVETIRACETAM 500 MG PO TABS: 500.0000 mg | ORAL_TABLET | Freq: Two times a day (BID) | ORAL | Status: DC

## 2014-03-01 MED ORDER — OXYCODONE-ACETAMINOPHEN 5-325 MG PO TABS: 1.0000 | ORAL_TABLET | Freq: Three times a day (TID) | ORAL | Status: DC | PRN

## 2014-03-01 MED ORDER — LORAZEPAM 1 MG PO TABS: ORAL_TABLET | ORAL | Status: DC

## 2014-03-01 NOTE — Progress Notes (Signed)
   Subjective:    Patient ID: Valerie Elliott, female    DOB: [birth year], 49 y.o.   MRN: [id number]  Hypertension This is a chronic problem. The current episode started more than 1 year ago. Pertinent negatives include no chest pain or shortness of breath. Compliance problems include exercise.   pt states no concerns today.  Requesting refill on oxycodone. Takes for neck pain. Pt does not take med every day just as needed.  Pt requesting a 30 day supply of keppra. 90 day supply was too expensive. She would like this changed to 30 day.  Pt would like a refill of ativan. Mother is in ICU in the hospital. Bilateral leg swelling. Started yesterday.    Review of Systems  Constitutional: Negative for activity change, appetite change and fatigue.  Respiratory: Negative for cough and shortness of breath.   Cardiovascular: Negative for chest pain.  Gastrointestinal: Negative for abdominal pain.  Endocrine: Negative for polydipsia and polyphagia.  Genitourinary: Negative for frequency.  Neurological: Negative for weakness.  Psychiatric/Behavioral: Negative for confusion.       Objective:   Physical Exam  Constitutional: She appears well-nourished. No distress.  Cardiovascular: Normal rate, regular rhythm and normal heart sounds.   No murmur heard. Pulmonary/Chest: Effort normal and breath sounds normal. No respiratory distress.  Musculoskeletal: She exhibits no edema.  Slight pedal edema  Lymphadenopathy:    She has no cervical adenopathy.  Neurological: She is alert. She exhibits normal muscle tone.  Psychiatric: Her behavior is normal.  Vitals reviewed.         Assessment & Plan:  #1 helpful hyperlipidemia healthy diet was recommended. Regular physical activity. Recheck it again in 612 months #2 HTN decent control believe some of her pedal edema is related to sitting at the hospital because of sickness in her family #3 lab work was ordered #4 neck pain discomfort oxycodone  intermittently prescription given patient states she uses it rarely neurosurgeon did not feel that surgery was indicated #5 insomnia uses Ativan for this.

## 2014-06-20 ENCOUNTER — Encounter: Payer: Self-pay | Admitting: Family Medicine

## 2014-06-20 ENCOUNTER — Ambulatory Visit (INDEPENDENT_AMBULATORY_CARE_PROVIDER_SITE_OTHER): Payer: 59 | Admitting: Family Medicine

## 2014-06-20 VITALS — BP 115/84 | Temp 98.4°F | Ht 70.0 in | Wt 228.5 lb

## 2014-06-20 DIAGNOSIS — F4321 Adjustment disorder with depressed mood: Secondary | ICD-10-CM | POA: Diagnosis not present

## 2014-06-20 DIAGNOSIS — H6982 Other specified disorders of Eustachian tube, left ear: Secondary | ICD-10-CM

## 2014-06-20 NOTE — Progress Notes (Signed)
   Subjective:    Patient ID: Valerie Elliott, female    DOB: Apr 27, 1965, 49 y.o.   MRN: [id number]  Otalgia  There is pain in the left ear. This is a new problem. The current episode started 1 to 4 weeks ago. The problem has been unchanged. Pertinent negatives include no coughing or rhinorrhea. She has tried nothing for the symptoms. The treatment provided no relief.   Patient states that "it feels like I'm in a tunnel, its pressure in my ear".   Review of Systems  Constitutional: Negative for fever and activity change.  HENT: Positive for ear pain. Negative for congestion and rhinorrhea.   Eyes: Negative for discharge.  Respiratory: Negative for cough, shortness of breath and wheezing.   Cardiovascular: Negative for chest pain.       Objective:   Physical Exam  Constitutional: She appears well-developed.  HENT:  Head: Normocephalic.  Nose: Nose normal.  Mouth/Throat: Oropharynx is clear and moist. No oropharyngeal exudate.  Neck: Neck supple.  Cardiovascular: Normal rate and normal heart sounds.   No murmur heard. Pulmonary/Chest: Effort normal and breath sounds normal. She has no wheezes.  Lymphadenopathy:    She has no cervical adenopathy.  Skin: Skin is warm and dry.  Nursing note and vitals reviewed.         Assessment & Plan:  Eustachian tube dysfunction-may use allergy medications for congestion see if that helps if progressive symptoms or if worse follow-up no need for antibiotics currently no need for ENT referral currently.  Grief reaction patient not depressed just feels sad related to the loss of her mother. Working through these issues. Sister undergoing chemotherapy.

## 2014-08-30 ENCOUNTER — Encounter: Payer: Self-pay | Admitting: Family Medicine

## 2014-08-30 ENCOUNTER — Ambulatory Visit (INDEPENDENT_AMBULATORY_CARE_PROVIDER_SITE_OTHER): Payer: 59 | Admitting: Family Medicine

## 2014-08-30 VITALS — BP 100/70 | Ht 70.0 in | Wt 230.5 lb

## 2014-08-30 DIAGNOSIS — H6992 Unspecified Eustachian tube disorder, left ear: Secondary | ICD-10-CM

## 2014-08-30 DIAGNOSIS — I10 Essential (primary) hypertension: Secondary | ICD-10-CM | POA: Diagnosis not present

## 2014-08-30 DIAGNOSIS — H6982 Other specified disorders of Eustachian tube, left ear: Secondary | ICD-10-CM | POA: Diagnosis not present

## 2014-08-30 MED ORDER — LISINOPRIL 5 MG PO TABS: ORAL_TABLET | ORAL | Status: DC

## 2014-08-30 MED ORDER — INDAPAMIDE 1.25 MG PO TABS: ORAL_TABLET | ORAL | Status: DC

## 2014-08-30 NOTE — Progress Notes (Signed)
   Subjective:    Patient ID: Valerie Elliott, female    DOB: 12/20/1965, 49 y.o.   MRN: [id number]  Hypertension This is a chronic problem. The current episode started more than 1 year ago. The problem has been gradually improving since onset. Pertinent negatives include no chest pain. There are no associated agents to hypertension. There are no known risk factors for coronary artery disease. Treatments tried: lisinopril, indapamide. The current treatment provides moderate improvement. There are no compliance problems.    Patient states that her left ear is stopped up. This has been present since her last office visit. Treatments tried: nasal spray with no relief.    Review of Systems  Constitutional: Negative for activity change, appetite change and fatigue.  HENT: Negative for congestion.   Respiratory: Negative for cough.   Cardiovascular: Negative for chest pain.  Gastrointestinal: Negative for abdominal pain.  Endocrine: Negative for polydipsia and polyphagia.  Neurological: Negative for weakness.  Psychiatric/Behavioral: Negative for confusion.       Objective:   Physical Exam  Constitutional: She appears well-nourished. No distress.  Cardiovascular: Normal rate, regular rhythm and normal heart sounds.   No murmur heard. Pulmonary/Chest: Effort normal and breath sounds normal. No respiratory distress.  Musculoskeletal: She exhibits no edema.  Lymphadenopathy:    She has no cervical adenopathy.  Neurological: She is alert. She exhibits normal muscle tone.  Psychiatric: Her behavior is normal.  Vitals reviewed.         Assessment & Plan:  1. Eustachian tube dysfunction, left  referral to ENT possible blocked eustachian tube may need endoscopic examination - Ambulatory referral to ENT  2. Essential hypertension  blood pressure good control continue current measures follow-up 6 months sooner if issues encouraged patient exercise lose weight - Basic metabolic panel

## 2014-08-30 NOTE — Patient Instructions (Signed)
Take Loratadine 10 mg daily and Flonase nasal spray, 2 sprays to nostrils daily.

## 2014-10-20 ENCOUNTER — Ambulatory Visit (INDEPENDENT_AMBULATORY_CARE_PROVIDER_SITE_OTHER): Payer: 59 | Admitting: Otolaryngology

## 2014-10-20 DIAGNOSIS — H93293 Other abnormal auditory perceptions, bilateral: Secondary | ICD-10-CM | POA: Diagnosis not present

## 2014-10-20 DIAGNOSIS — H6983 Other specified disorders of Eustachian tube, bilateral: Secondary | ICD-10-CM | POA: Diagnosis not present

## 2014-10-21 LAB — BASIC METABOLIC PANEL
BUN / CREAT RATIO: 7 — AB (ref 9–23)
BUN: 7 mg/dL (ref 6–24)
CO2: 29 mmol/L (ref 18–29)
Calcium: 9.4 mg/dL (ref 8.7–10.2)
Chloride: 97 mmol/L (ref 97–108)
Creatinine, Ser: 1.03 mg/dL — ABNORMAL HIGH (ref 0.57–1.00)
GFR calc non Af Amer: 64 mL/min/{1.73_m2} (ref >59)
GFR, EST AFRICAN AMERICAN: 74 mL/min/{1.73_m2} (ref >59)
GLUCOSE: 96 mg/dL (ref 65–99)
POTASSIUM: 3.6 mmol/L (ref 3.5–5.2)
Sodium: 140 mmol/L (ref 134–144)

## 2014-10-23 ENCOUNTER — Encounter: Payer: Self-pay | Admitting: Family Medicine

## 2014-11-17 ENCOUNTER — Ambulatory Visit (INDEPENDENT_AMBULATORY_CARE_PROVIDER_SITE_OTHER): Payer: 59 | Admitting: Otolaryngology

## 2014-11-17 DIAGNOSIS — H6983 Other specified disorders of Eustachian tube, bilateral: Secondary | ICD-10-CM

## 2014-11-18 ENCOUNTER — Other Ambulatory Visit: Payer: Self-pay | Admitting: Family Medicine

## 2014-11-18 NOTE — Telephone Encounter (Signed)
May have each prescription with 3 refills

## 2015-02-27 ENCOUNTER — Ambulatory Visit: Payer: Self-pay | Admitting: Family Medicine

## 2015-03-15 ENCOUNTER — Other Ambulatory Visit: Payer: Self-pay | Admitting: Family Medicine

## 2015-03-27 ENCOUNTER — Telehealth: Payer: Self-pay | Admitting: Nurse Practitioner

## 2015-03-27 ENCOUNTER — Encounter: Payer: Self-pay | Admitting: Family Medicine

## 2015-03-27 ENCOUNTER — Ambulatory Visit (INDEPENDENT_AMBULATORY_CARE_PROVIDER_SITE_OTHER): Payer: BLUE CROSS/BLUE SHIELD | Admitting: Family Medicine

## 2015-03-27 VITALS — BP 122/74 | Ht 70.0 in | Wt 232.0 lb

## 2015-03-27 DIAGNOSIS — I10 Essential (primary) hypertension: Secondary | ICD-10-CM | POA: Diagnosis not present

## 2015-03-27 DIAGNOSIS — E785 Hyperlipidemia, unspecified: Secondary | ICD-10-CM

## 2015-03-27 DIAGNOSIS — J069 Acute upper respiratory infection, unspecified: Secondary | ICD-10-CM | POA: Diagnosis not present

## 2015-03-27 MED ORDER — INDAPAMIDE 1.25 MG PO TABS: 1.2500 mg | ORAL_TABLET | Freq: Every morning | ORAL | Status: DC

## 2015-03-27 MED ORDER — LEVETIRACETAM 500 MG PO TABS: 500.0000 mg | ORAL_TABLET | Freq: Two times a day (BID) | ORAL | Status: DC

## 2015-03-27 MED ORDER — LISINOPRIL 5 MG PO TABS: 5.0000 mg | ORAL_TABLET | Freq: Every morning | ORAL | Status: DC

## 2015-03-27 NOTE — Progress Notes (Signed)
Orders for bw put in and mailed to pt for her to do within 4 weeks.

## 2015-03-27 NOTE — Addendum Note (Signed)
Addended by: Carmelina Noun on: 03/27/2015 02:34 PM   Modules accepted: Orders

## 2015-03-27 NOTE — Progress Notes (Signed)
   Subjective:    Patient ID: Valerie Elliott, female    DOB: [birth year], 50 y.o.   MRN: [id number]  Hypertension This is a chronic problem. The current episode started more than 1 year ago. Pertinent negatives include no chest pain or shortness of breath. Treatments tried: lisinopril and lozol. There are no compliance problems.    patient relates takes her medicines tries to watch how she eats feels she could do better job watching diet and stay physically active denies any other particular troubles currently Patient states she also has congestion and cough for one week.   Review of Systems  Constitutional: Negative for fever and activity change.  HENT: Positive for congestion and rhinorrhea. Negative for ear pain.   Eyes: Negative for discharge.  Respiratory: Positive for cough. Negative for shortness of breath and wheezing.   Cardiovascular: Negative for chest pain.       Objective:   Physical Exam  Constitutional: She appears well-developed.  HENT:  Head: Normocephalic.  Nose: Nose normal.  Mouth/Throat: Oropharynx is clear and moist. No oropharyngeal exudate.  Neck: Neck supple.  Cardiovascular: Normal rate and normal heart sounds.   No murmur heard. Pulmonary/Chest: Effort normal and breath sounds normal. She has no wheezes.  Lymphadenopathy:    She has no cervical adenopathy.  Skin: Skin is warm and dry.  Nursing note and vitals reviewed.         Assessment & Plan:   hypertension decent control continue current measures check lab work regular physical activity try to lose weight  Viral URI no need for antibiotics. Warning signs discuss if ongoing troubles call back   hyperlipidemia-mild elevation needs a recheck

## 2015-03-27 NOTE — Telephone Encounter (Signed)
Pt would like bw orders for PE on 3/13   Last labs 10/20/14 BMP 02/08/14 Lip

## 2015-03-28 ENCOUNTER — Encounter: Payer: Self-pay | Admitting: Family Medicine

## 2015-03-28 DIAGNOSIS — G40909 Epilepsy, unspecified, not intractable, without status epilepticus: Secondary | ICD-10-CM | POA: Insufficient documentation

## 2015-03-30 ENCOUNTER — Other Ambulatory Visit: Payer: Self-pay | Admitting: *Deleted

## 2015-03-30 DIAGNOSIS — Z79899 Other long term (current) drug therapy: Secondary | ICD-10-CM

## 2015-03-30 DIAGNOSIS — E559 Vitamin D deficiency, unspecified: Secondary | ICD-10-CM

## 2015-03-30 NOTE — Telephone Encounter (Signed)
Orders ready. Pt notified. Pt wanted mailed to home. Orders put in mail

## 2015-03-30 NOTE — Telephone Encounter (Signed)
Scott already has 2 orders in the system. Please add liver profile and vitamin D. Thanks.

## 2015-04-11 LAB — VITAMIN D 25 HYDROXY (VIT D DEFICIENCY, FRACTURES): Vit D, 25-Hydroxy: 8.7 ng/mL — ABNORMAL LOW (ref 30.0–100.0)

## 2015-04-11 LAB — BASIC METABOLIC PANEL
BUN/Creatinine Ratio: 13 (ref 9–23)
BUN: 13 mg/dL (ref 6–24)
CALCIUM: 9 mg/dL (ref 8.7–10.2)
CHLORIDE: 98 mmol/L (ref 96–106)
CO2: 23 mmol/L (ref 18–29)
Creatinine, Ser: 0.98 mg/dL (ref 0.57–1.00)
GFR calc Af Amer: 78 mL/min/{1.73_m2} (ref >59)
GFR, EST NON AFRICAN AMERICAN: 68 mL/min/{1.73_m2} (ref >59)
GLUCOSE: 97 mg/dL (ref 65–99)
POTASSIUM: 4 mmol/L (ref 3.5–5.2)
SODIUM: 139 mmol/L (ref 134–144)

## 2015-04-11 LAB — LIPID PANEL
CHOL/HDL RATIO: 4.3 ratio (ref 0.0–4.4)
CHOLESTEROL TOTAL: 185 mg/dL (ref 100–199)
HDL: 43 mg/dL (ref >39)
LDL CALC: 119 mg/dL — AB (ref 0–99)
Triglycerides: 115 mg/dL (ref 0–149)
VLDL CHOLESTEROL CAL: 23 mg/dL (ref 5–40)

## 2015-04-11 LAB — HEPATIC FUNCTION PANEL
ALBUMIN: 3.9 g/dL (ref 3.5–5.5)
ALT: 13 IU/L (ref 0–32)
AST: 13 IU/L (ref 0–40)
Alkaline Phosphatase: 66 IU/L (ref 39–117)
BILIRUBIN TOTAL: 0.6 mg/dL (ref 0.0–1.2)
Bilirubin, Direct: 0.15 mg/dL (ref 0.00–0.40)
TOTAL PROTEIN: 6.5 g/dL (ref 6.0–8.5)

## 2015-04-17 ENCOUNTER — Encounter: Payer: BLUE CROSS/BLUE SHIELD | Admitting: Nurse Practitioner

## 2015-04-17 ENCOUNTER — Encounter: Payer: Self-pay | Admitting: Nurse Practitioner

## 2015-04-22 NOTE — Progress Notes (Signed)
This encounter was created in error - please disregard.

## 2015-04-28 ENCOUNTER — Other Ambulatory Visit: Payer: Self-pay | Admitting: Nurse Practitioner

## 2015-04-28 ENCOUNTER — Encounter: Payer: Self-pay | Admitting: Nurse Practitioner

## 2015-04-28 ENCOUNTER — Ambulatory Visit (INDEPENDENT_AMBULATORY_CARE_PROVIDER_SITE_OTHER): Payer: BLUE CROSS/BLUE SHIELD | Admitting: Nurse Practitioner

## 2015-04-28 VITALS — BP 114/76 | Ht 68.0 in | Wt 230.2 lb

## 2015-04-28 DIAGNOSIS — Z1231 Encounter for screening mammogram for malignant neoplasm of breast: Secondary | ICD-10-CM

## 2015-04-28 DIAGNOSIS — E559 Vitamin D deficiency, unspecified: Secondary | ICD-10-CM | POA: Diagnosis not present

## 2015-04-28 DIAGNOSIS — Z124 Encounter for screening for malignant neoplasm of cervix: Secondary | ICD-10-CM | POA: Diagnosis not present

## 2015-04-28 DIAGNOSIS — Z0000 Encounter for general adult medical examination without abnormal findings: Secondary | ICD-10-CM | POA: Diagnosis not present

## 2015-04-28 DIAGNOSIS — Z1151 Encounter for screening for human papillomavirus (HPV): Secondary | ICD-10-CM

## 2015-04-28 DIAGNOSIS — Z113 Encounter for screening for infections with a predominantly sexual mode of transmission: Secondary | ICD-10-CM | POA: Diagnosis not present

## 2015-04-28 DIAGNOSIS — Z01419 Encounter for gynecological examination (general) (routine) without abnormal findings: Secondary | ICD-10-CM

## 2015-04-28 MED ORDER — VITAMIN D (ERGOCALCIFEROL) 1.25 MG (50000 UNIT) PO CAPS: 50000.0000 [IU] | ORAL_CAPSULE | ORAL | Status: DC

## 2015-04-29 ENCOUNTER — Encounter: Payer: Self-pay | Admitting: Nurse Practitioner

## 2015-04-29 DIAGNOSIS — E559 Vitamin D deficiency, unspecified: Secondary | ICD-10-CM | POA: Insufficient documentation

## 2015-04-29 NOTE — Progress Notes (Signed)
Subjective:    Patient ID: Valerie Elliott, female    DOB: 09-18-1965, 50 y.o.   MRN: [id number]  HPI presents for her wellness exam. Same sexual partner. Regular menses, normal flow lasting 3-5 days. Has been eating more healthy. Has decreased her intake of sweet tea. Needs an eye exam. Regular dental exam. Both her sister and brother have a history of colon cancer, patient has not had her colonoscopy. Both in their 72s when diagnosed.    Review of Systems  Constitutional: Negative for activity change, appetite change and fatigue.  HENT: Negative for dental problem, ear pain, sinus pressure and sore throat.   Respiratory: Negative for cough, chest tightness, shortness of breath and wheezing.   Cardiovascular: Negative for chest pain.  Gastrointestinal: Negative for nausea, vomiting, abdominal pain, diarrhea, constipation, blood in stool and abdominal distention.  Genitourinary: Negative for dysuria, urgency, frequency, vaginal discharge, enuresis, difficulty urinating, genital sores, menstrual problem and pelvic pain.       Objective:   Physical Exam  Constitutional: She is oriented to person, place, and time. She appears well-developed. No distress.  HENT:  Right Ear: External ear normal.  Left Ear: External ear normal.  Mouth/Throat: Oropharynx is clear and moist.  Neck: Normal range of motion. Neck supple. No tracheal deviation present. No thyromegaly present.  Cardiovascular: Normal rate, regular rhythm and normal heart sounds.  Exam reveals no gallop.   No murmur heard. Pulmonary/Chest: Effort normal and breath sounds normal.  Abdominal: Soft. She exhibits no distension. There is no tenderness.  Genitourinary: Vagina normal and uterus normal. No vaginal discharge found.  External GU: No rashes or lesions. Vagina no discharge. Bimanual exam no tenderness or obvious masses.  Musculoskeletal: She exhibits no edema.  Lymphadenopathy:    She has no cervical adenopathy.    Neurological: She is alert and oriented to person, place, and time.  Skin: Skin is warm and dry. No rash noted.  Psychiatric: She has a normal mood and affect. Her behavior is normal.  Vitals reviewed.  Breast exam: Minimal fine nodularity, dense tissue. No masses. Axilla no adenopathy.  Reviewed labs with patient from 04/10/2015.      Assessment & Plan:   Problem List Items Addressed This Visit      Other   Vitamin D deficiency    Other Visit Diagnoses    Well woman exam    -  Primary    Relevant Orders    Pap IG, CT/NG NAA, and HPV (high risk)    Screening for cervical cancer        Relevant Orders    Pap IG, CT/NG NAA, and HPV (high risk)    Screening for HPV (human papillomavirus)        Relevant Orders    Pap IG, CT/NG NAA, and HPV (high risk)    Screen for STD (sexually transmitted disease)        Relevant Orders    Pap IG, CT/NG NAA, and HPV (high risk)    Visit for screening mammogram        Relevant Orders    MM DIGITAL SCREENING BILATERAL      Meds ordered this encounter  Medications  . Vitamin D, Ergocalciferol, (DRISDOL) 50000 units CAPS capsule    Sig: Take 1 capsule (50,000 Units total) by mouth every 7 (seven) days.    Dispense:  4 capsule    Refill:  2    Order Specific Question:  Supervising Provider    Answer:  Mikey Kirschner [[id number]]   Encourage regular activity and healthy diet. Recommend patient consider getting her colonoscopy now due to family history but she defers it until next year at her physical after she turns 65. To call back sooner if any unusual abdominal pain or blood in her stools.

## 2015-05-03 ENCOUNTER — Ambulatory Visit (HOSPITAL_COMMUNITY)
Admission: RE | Admit: 2015-05-03 | Discharge: 2015-05-03 | Disposition: A | Discharge status: Home / Self Care | Payer: BLUE CROSS/BLUE SHIELD | Source: Ambulatory Visit | Attending: Nurse Practitioner | Admitting: Nurse Practitioner

## 2015-05-03 DIAGNOSIS — Z1231 Encounter for screening mammogram for malignant neoplasm of breast: Secondary | ICD-10-CM

## 2015-05-08 LAB — PAP IG, CT-NG NAA, HPV HIGH-RISK
CHLAMYDIA, NUC. ACID AMP: NEGATIVE
GONOCOCCUS BY NUCLEIC ACID AMP: NEGATIVE
HPV, high-risk: NEGATIVE
PAP SMEAR COMMENT: 0

## 2015-10-30 ENCOUNTER — Ambulatory Visit (INDEPENDENT_AMBULATORY_CARE_PROVIDER_SITE_OTHER): Payer: BLUE CROSS/BLUE SHIELD | Admitting: Nurse Practitioner

## 2015-10-30 ENCOUNTER — Encounter: Payer: Self-pay | Admitting: Nurse Practitioner

## 2015-10-30 VITALS — BP 122/82 | Ht 68.0 in | Wt 236.1 lb

## 2015-10-30 DIAGNOSIS — I10 Essential (primary) hypertension: Secondary | ICD-10-CM

## 2015-10-30 MED ORDER — INDAPAMIDE 1.25 MG PO TABS: 1.2500 mg | ORAL_TABLET | Freq: Every morning | ORAL | 1 refills | Status: DC

## 2015-10-30 MED ORDER — PHENTERMINE HCL 37.5 MG PO TABS: 37.5000 mg | ORAL_TABLET | Freq: Every day | ORAL | 0 refills | Status: DC

## 2015-10-30 MED ORDER — LISINOPRIL 5 MG PO TABS: 5.0000 mg | ORAL_TABLET | Freq: Every morning | ORAL | 1 refills | Status: DC

## 2015-10-30 NOTE — Progress Notes (Signed)
Subjective:  Presents for routine follow up of HTN. Still struggling with weight. Would like to try medication to help. No CP/ischemic type pain, SOB or edema. Compliant with meds.   Objective:   BP 122/82   Ht 5\' 8"  (1.727 m)   Wt 236 lb 2 oz (107.1 kg)   BMI 35.90 kg/m  NAD. Alert, oriented. Lungs clear. Heart RRR. Lower extremities no edema. BP on recheck right arm sitting 114/76.  Assessment:  Problem List Items Addressed This Visit      Cardiovascular and Mediastinum   HTN (hypertension) - Primary   Relevant Medications   lisinopril (PRINIVIL,ZESTRIL) 5 MG tablet   indapamide (LOZOL) 1.25 MG tablet     Other   Morbid obesity due to excess calories (HCC)   Relevant Medications   phentermine (ADIPEX-P) 37.5 MG tablet    Other Visit Diagnoses   None.    Plan:  Meds ordered this encounter  Medications  . phentermine (ADIPEX-P) 37.5 MG tablet    Sig: Take 1 tablet (37.5 mg total) by mouth daily before breakfast.    Dispense:  30 tablet    Refill:  0    Order Specific Question:   Supervising Provider    Answer:   Mikey Kirschner [[id number]]  . lisinopril (PRINIVIL,ZESTRIL) 5 MG tablet    Sig: Take 1 tablet (5 mg total) by mouth every morning.    Dispense:  90 tablet    Refill:  1    Order Specific Question:   Supervising Provider    Answer:   Mikey Kirschner [[id number]]  . indapamide (LOZOL) 1.25 MG tablet    Sig: Take 1 tablet (1.25 mg total) by mouth every morning.    Dispense:  90 tablet    Refill:  1    Order Specific Question:   Supervising Provider    Answer:   Mikey Kirschner [[id number]]   Reviewed potential adverse effects of phentermine. DC med if any problems. Encouraged healthy diet and regular activity.  Return in about 1 month (around 11/29/2015) for recheck.

## 2015-10-30 NOTE — Patient Instructions (Signed)
Vitamin D 1000-2000 units per day.

## 2015-11-16 ENCOUNTER — Telehealth: Payer: Self-pay | Admitting: Family Medicine

## 2015-11-16 NOTE — Telephone Encounter (Signed)
Patient said she had a pap smear not to long ago here at our office and would like to know if we can send in an Rx for a birth control pill without being seen again.  [place]

## 2015-11-17 ENCOUNTER — Other Ambulatory Visit: Payer: Self-pay | Admitting: Nurse Practitioner

## 2015-11-17 NOTE — Telephone Encounter (Signed)
She has to come in for recheck on 10/25. We will discuss then since I want to go over options and discuss safety issues.

## 2015-11-17 NOTE — Telephone Encounter (Signed)
Notified patient she has to come in for recheck on 10/25. We will discuss then since Hoyle Sauer want to go over options and discuss safety issues. Patient verbalized understanding.

## 2015-11-17 NOTE — Telephone Encounter (Signed)
Left message on voicemail to return call.

## 2015-11-29 ENCOUNTER — Ambulatory Visit (INDEPENDENT_AMBULATORY_CARE_PROVIDER_SITE_OTHER): Payer: BLUE CROSS/BLUE SHIELD | Admitting: Nurse Practitioner

## 2015-11-29 ENCOUNTER — Encounter: Payer: Self-pay | Admitting: Nurse Practitioner

## 2015-11-29 VITALS — BP 110/74 | Ht 68.0 in | Wt 234.5 lb

## 2015-11-29 DIAGNOSIS — N920 Excessive and frequent menstruation with regular cycle: Secondary | ICD-10-CM | POA: Diagnosis not present

## 2015-11-29 DIAGNOSIS — Z30011 Encounter for initial prescription of contraceptive pills: Secondary | ICD-10-CM | POA: Diagnosis not present

## 2015-11-29 MED ORDER — NORETHINDRONE 0.35 MG PO TABS: 1.0000 | ORAL_TABLET | Freq: Every day | ORAL | 11 refills | Status: DC

## 2015-11-29 MED ORDER — FLUTICASONE PROPIONATE 50 MCG/ACT NA SUSP: 2.0000 | Freq: Every day | NASAL | 6 refills | Status: DC

## 2015-11-29 NOTE — Patient Instructions (Signed)
Nexplanon   Mirena

## 2015-11-30 ENCOUNTER — Encounter: Payer: Self-pay | Admitting: Nurse Practitioner

## 2015-11-30 NOTE — Progress Notes (Signed)
Subjective:  Presents requesting contraceptives. Would like to try birth control pills. Nonsmoker. No family history of breast cancer. Cycles are regular but very heavy flow for the first 2 days, total of 5 days. Same sexual partner. Defers STD testing. No fever or pelvic pain or discharge. Has not started her phentermine for weight loss see previous note.  Objective:   BP 110/74   Ht 5\' 8"  (1.727 m)   Wt 234 lb 8 oz (106.4 kg)   BMI 35.66 kg/m  NAD. Alert, oriented. Lungs clear. Heart regular rate rhythm.  Assessment: Menorrhagia with regular cycle  Encounter for initial prescription of contraceptive pills  Plan:  Meds ordered this encounter  Medications  . norethindrone (MICRONOR,CAMILA,ERRIN) 0.35 MG tablet    Sig: Take 1 tablet (0.35 mg total) by mouth daily.    Dispense:  1 Package    Refill:  11    Order Specific Question:   Supervising Provider    Answer:   Mikey Kirschner [[id number]]  . fluticasone (FLONASE) 50 MCG/ACT nasal spray    Sig: Place 2 sprays into both nostrils daily.    Dispense:  16 g    Refill:  6    Order Specific Question:   Supervising Provider    Answer:   Mikey Kirschner [[id number]]   Discussed contraceptive options. Patient wishes to take a progesterone only pill. Discussed potential risk associated with OC use. Call back if any heavy or prolonged bleeding.

## 2015-12-26 ENCOUNTER — Telehealth: Payer: Self-pay | Admitting: Nurse Practitioner

## 2015-12-26 NOTE — Telephone Encounter (Signed)
Patient wants the NuvaRing. Patient also wants refill on her phentermine.

## 2015-12-26 NOTE — Telephone Encounter (Signed)
Left message on voicemail to return call.

## 2015-12-26 NOTE — Telephone Encounter (Signed)
Patient says she was given a birth control pill norethindrone (MICRONOR,CAMILA,ERRIN) 0.35 MG tablet and she is not liking the side effects such a spotting and bleeding.  It also broke her partner out.  She is hoping that we can switch her to something else.  [place]

## 2015-12-26 NOTE — Telephone Encounter (Signed)
Does she want another contraceptive pill or a different method such as Depo Provera?

## 2015-12-27 ENCOUNTER — Other Ambulatory Visit: Payer: Self-pay | Admitting: Nurse Practitioner

## 2015-12-27 MED ORDER — ETONOGESTREL-ETHINYL ESTRADIOL 0.12-0.015 MG/24HR VA RING: VAGINAL_RING | VAGINAL | 11 refills | Status: DC

## 2015-12-27 MED ORDER — PHENTERMINE HCL 37.5 MG PO TABS: 37.5000 mg | ORAL_TABLET | Freq: Every day | ORAL | 0 refills | Status: DC

## 2015-12-27 MED ORDER — PHENTERMINE HCL 37.5 MG PO TABS: 37.5000 mg | ORAL_TABLET | Freq: Every day | ORAL | 2 refills | Status: DC

## 2016-03-05 ENCOUNTER — Ambulatory Visit
Admission: RE | Admit: 2016-03-05 | Payer: BLUE CROSS/BLUE SHIELD | Source: Ambulatory Visit | Admitting: Gastroenterology

## 2016-03-05 ENCOUNTER — Encounter: Admission: RE | Payer: Self-pay | Source: Ambulatory Visit

## 2016-03-05 SURGERY — COLONOSCOPY WITH PROPOFOL
Anesthesia: General

## 2016-04-02 ENCOUNTER — Telehealth: Payer: Self-pay | Admitting: Family Medicine

## 2016-04-02 DIAGNOSIS — Z1211 Encounter for screening for malignant neoplasm of colon: Secondary | ICD-10-CM

## 2016-04-02 NOTE — Telephone Encounter (Signed)
Referral ordered in EPIC. 

## 2016-04-02 NOTE — Telephone Encounter (Signed)
Pt would like referral for colonoscopy Pt's ok with a GI in [place]  Please advise & initiate in system so that I may process

## 2016-04-02 NOTE — Telephone Encounter (Signed)
May have referral please do

## 2016-04-03 ENCOUNTER — Encounter (INDEPENDENT_AMBULATORY_CARE_PROVIDER_SITE_OTHER): Payer: Self-pay | Admitting: *Deleted

## 2016-04-03 ENCOUNTER — Encounter: Payer: Self-pay | Admitting: Family Medicine

## 2016-04-04 ENCOUNTER — Other Ambulatory Visit: Payer: Self-pay | Admitting: Family Medicine

## 2016-04-04 ENCOUNTER — Other Ambulatory Visit: Payer: Self-pay | Admitting: Nurse Practitioner

## 2016-04-11 ENCOUNTER — Other Ambulatory Visit (INDEPENDENT_AMBULATORY_CARE_PROVIDER_SITE_OTHER): Payer: Self-pay | Admitting: *Deleted

## 2016-04-11 DIAGNOSIS — Z800 Family history of malignant neoplasm of digestive organs: Secondary | ICD-10-CM

## 2016-04-11 DIAGNOSIS — Z1211 Encounter for screening for malignant neoplasm of colon: Secondary | ICD-10-CM

## 2016-04-19 ENCOUNTER — Ambulatory Visit (INDEPENDENT_AMBULATORY_CARE_PROVIDER_SITE_OTHER): Payer: BLUE CROSS/BLUE SHIELD | Admitting: Nurse Practitioner

## 2016-04-19 ENCOUNTER — Encounter: Payer: Self-pay | Admitting: Nurse Practitioner

## 2016-04-19 VITALS — BP 112/70 | Ht 68.0 in | Wt 220.2 lb

## 2016-04-19 DIAGNOSIS — I10 Essential (primary) hypertension: Secondary | ICD-10-CM | POA: Diagnosis not present

## 2016-04-19 MED ORDER — PHENTERMINE HCL 37.5 MG PO TABS: 37.5000 mg | ORAL_TABLET | Freq: Every day | ORAL | 2 refills | Status: DC

## 2016-04-19 NOTE — Progress Notes (Signed)
Subjective:  Presents today for routine follow up to discuss weight loss.  Has taken Phentermine 37.5mg  daily for 3 months with good compliance and tolerance. Denies SOB, wheezing, or cough. Denies any CP, palpitations, or LE swelling.  Has increased energy with phentermine.  Continues to work on lifestyle modifications with improved dietary habits and walking for exercise.  Denies any recent seizure activity.    Gets routine physicals completed, colonoscopy scheduled for 07/2016, brother and sister both positive for colon cancer.    Objective:   BP 112/70   Ht 5\' 8"  (1.727 m)   Wt 220 lb 4 oz (99.9 kg)   BMI 33.49 kg/m  Alert and oriented, NAD.  Chest: Lungs CTA  Cardiac: RRR, no murmurs   Assessment:   Problem List Items Addressed This Visit      Cardiovascular and Mediastinum   HTN (hypertension) - Primary     Other   Morbid obesity due to excess calories (HCC)   Relevant Medications   phentermine (ADIPEX-P) 37.5 MG tablet     Plan:   Meds ordered this encounter  Medications  . phentermine (ADIPEX-P) 37.5 MG tablet    Sig: Take 1 tablet (37.5 mg total) by mouth daily before breakfast.    Dispense:  30 tablet    Refill:  2    Order Specific Question:   Supervising Provider    Answer:   Mikey Kirschner [[id number]]   Continue Phentermine for 3 more months, for total of 6 months of therapy.  Continue lifestyle modifications.  Notify office with any questions or concerns.    Recommend annual wellness exam.  Patient to schedule and to notify office of plan to schedule for routine blood work prior to visit.

## 2016-06-06 ENCOUNTER — Encounter (INDEPENDENT_AMBULATORY_CARE_PROVIDER_SITE_OTHER): Payer: Self-pay | Admitting: *Deleted

## 2016-06-06 ENCOUNTER — Telehealth (INDEPENDENT_AMBULATORY_CARE_PROVIDER_SITE_OTHER): Payer: Self-pay | Admitting: *Deleted

## 2016-06-06 MED ORDER — PEG 3350-KCL-NA BICARB-NACL 420 G PO SOLR: 4000.0000 mL | Freq: Once | ORAL | 0 refills | Status: AC

## 2016-06-06 NOTE — Telephone Encounter (Signed)
Patient needs trilyte 

## 2016-06-07 ENCOUNTER — Encounter (INDEPENDENT_AMBULATORY_CARE_PROVIDER_SITE_OTHER): Payer: Self-pay | Admitting: *Deleted

## 2016-06-07 NOTE — Telephone Encounter (Signed)
This encounter was created in error - please disregard.

## 2016-06-24 ENCOUNTER — Telehealth (INDEPENDENT_AMBULATORY_CARE_PROVIDER_SITE_OTHER): Payer: Self-pay | Admitting: *Deleted

## 2016-06-24 NOTE — Telephone Encounter (Signed)
Referring MD/PCP: scott luking   Procedure: tcs  Reason/Indication:  Screening, fam hx colon ca  Has patient had this procedure before?  no  If so, when, by whom and where?    Is there a family history of colon cancer?  Yes, sister, father  Who?  What age when diagnosed?    Is patient diabetic?   no      Does patient have prosthetic heart valve or mechanical valve?  no  Do you have a pacemaker?  no  Has patient ever had endocarditis? no  Has patient had joint replacement within last 12 months?  no  Does patient tend to be constipated or take laxatives? no  Does patient have a history of alcohol/drug use?  no  Is patient on Coumadin, Plavix and/or Aspirin? no  Medications: see epic  Allergies: see epic  Medication Adjustment per Dr Laural Golden:   Procedure date & time: 07/25/16 at 1030

## 2016-06-24 NOTE — Telephone Encounter (Signed)
agree

## 2016-06-25 ENCOUNTER — Encounter: Payer: Self-pay | Admitting: Family Medicine

## 2016-07-04 ENCOUNTER — Encounter: Payer: Self-pay | Admitting: Family Medicine

## 2016-07-25 ENCOUNTER — Encounter (HOSPITAL_COMMUNITY): Payer: Self-pay | Admitting: *Deleted

## 2016-07-25 ENCOUNTER — Encounter (HOSPITAL_COMMUNITY)
Admission: RE | Disposition: A | Discharge status: Home / Self Care | Payer: Self-pay | Source: Ambulatory Visit | Attending: Internal Medicine

## 2016-07-25 ENCOUNTER — Ambulatory Visit (HOSPITAL_COMMUNITY)
Admission: RE | Admit: 2016-07-25 | Discharge: 2016-07-25 | Disposition: A | Discharge status: Home / Self Care | Payer: BLUE CROSS/BLUE SHIELD | Source: Ambulatory Visit | Attending: Internal Medicine | Admitting: Internal Medicine

## 2016-07-25 DIAGNOSIS — F419 Anxiety disorder, unspecified: Secondary | ICD-10-CM | POA: Diagnosis not present

## 2016-07-25 DIAGNOSIS — D125 Benign neoplasm of sigmoid colon: Secondary | ICD-10-CM | POA: Diagnosis not present

## 2016-07-25 DIAGNOSIS — D120 Benign neoplasm of cecum: Secondary | ICD-10-CM | POA: Insufficient documentation

## 2016-07-25 DIAGNOSIS — K644 Residual hemorrhoidal skin tags: Secondary | ICD-10-CM | POA: Insufficient documentation

## 2016-07-25 DIAGNOSIS — Z79899 Other long term (current) drug therapy: Secondary | ICD-10-CM | POA: Insufficient documentation

## 2016-07-25 DIAGNOSIS — K5730 Diverticulosis of large intestine without perforation or abscess without bleeding: Secondary | ICD-10-CM | POA: Diagnosis not present

## 2016-07-25 DIAGNOSIS — Z800 Family history of malignant neoplasm of digestive organs: Secondary | ICD-10-CM | POA: Insufficient documentation

## 2016-07-25 DIAGNOSIS — K648 Other hemorrhoids: Secondary | ICD-10-CM | POA: Insufficient documentation

## 2016-07-25 DIAGNOSIS — I10 Essential (primary) hypertension: Secondary | ICD-10-CM | POA: Diagnosis not present

## 2016-07-25 DIAGNOSIS — Z1211 Encounter for screening for malignant neoplasm of colon: Secondary | ICD-10-CM | POA: Diagnosis not present

## 2016-07-25 HISTORY — PX: COLONOSCOPY: SHX5424

## 2016-07-25 HISTORY — DX: Other seasonal allergic rhinitis: J30.2

## 2016-07-25 HISTORY — DX: Essential (primary) hypertension: I10

## 2016-07-25 HISTORY — DX: Anxiety disorder, unspecified: F41.9

## 2016-07-25 HISTORY — DX: Unspecified convulsions: R56.9

## 2016-07-25 HISTORY — PX: POLYPECTOMY: SHX5525

## 2016-07-25 SURGERY — COLONOSCOPY
Anesthesia: Moderate Sedation

## 2016-07-25 MED ORDER — MIDAZOLAM HCL 5 MG/5ML IJ SOLN
INTRAMUSCULAR | Status: DC | PRN
  Administered 2016-07-25: 1 mg via INTRAVENOUS
  Administered 2016-07-25 (×3): 2 mg via INTRAVENOUS

## 2016-07-25 MED ORDER — MEPERIDINE HCL 50 MG/ML IJ SOLN
INTRAMUSCULAR | Status: AC
  Filled 2016-07-25: qty 1

## 2016-07-25 MED ORDER — SODIUM CHLORIDE 0.9 % IV SOLN
INTRAVENOUS | Status: DC
  Administered 2016-07-25: 09:00:00 via INTRAVENOUS

## 2016-07-25 MED ORDER — SIMETHICONE 40 MG/0.6ML PO SUSP
ORAL | Status: DC | PRN
  Administered 2016-07-25: 11:00:00

## 2016-07-25 MED ORDER — MEPERIDINE HCL 50 MG/ML IJ SOLN
INTRAMUSCULAR | Status: DC | PRN
  Administered 2016-07-25 (×2): 25 mg via INTRAVENOUS

## 2016-07-25 MED ORDER — MIDAZOLAM HCL 5 MG/5ML IJ SOLN
INTRAMUSCULAR | Status: AC
  Filled 2016-07-25: qty 10

## 2016-07-25 NOTE — H&P (Signed)
Valerie Elliott is an 51 y.o. female.   Chief Complaint: Patient is here for colonoscopy. HPI: Patient is 51 year old African-American female who is here for screening colonoscopy. Her sister was diagnosed with colon carcinoma at age 40 and now being treated for metastatic disease. Patient denies abdominal pain change in bowel habits or rectal bleeding. This is patient's first exam.  Past Medical History:  Diagnosis Date  . Anxiety   . Hypertension   . Seasonal allergies   . Seizures ([facility])     Past Surgical History:  Procedure Laterality Date  . CHOLECYSTECTOMY  04/04/2008    Family History  Problem Relation Age of Onset  . Colon cancer Sister   .      Social History:  reports that she has never smoked. She has never used smokeless tobacco. She reports that she does not drink alcohol or use drugs.  Allergies:  Allergies  Allergen Reactions  . Hydrocodone Hives  . Dulcolax [Bisacodyl] Palpitations    Medications Prior to Admission  Medication Sig Dispense Refill  . Biotin 1000 MCG tablet Take 1,000 mcg by mouth daily.    . Cholecalciferol (VITAMIN D PO) Take 1 capsule by mouth daily.    . diphenhydrAMINE-zinc acetate (BENADRYL) cream Apply 1 application topically daily as needed for itching.    . fluticasone (FLONASE) 50 MCG/ACT nasal spray Place 2 sprays into both nostrils daily. 16 g 6  . ibuprofen (ADVIL,MOTRIN) 200 MG tablet Take 400 mg by mouth daily as needed for headache or moderate pain.    . indapamide (LOZOL) 1.25 MG tablet Take 1 tablet (1.25 mg total) by mouth every morning. 90 tablet 1  . levETIRAcetam (KEPPRA) 500 MG tablet TAKE ONE TABLET BY MOUTH TWICE DAILY (Patient taking differently: Take 500mg s by mouth daily in the morning) 180 tablet 0  . lisinopril (PRINIVIL,ZESTRIL) 5 MG tablet Take 1 tablet (5 mg total) by mouth every morning. 90 tablet 1  . phentermine (ADIPEX-P) 37.5 MG tablet Take 1 tablet (37.5 mg total) by mouth daily before breakfast. (Patient  taking differently: Take 37.5 mg by mouth every other day. ) 30 tablet 2  . cyclobenzaprine (FLEXERIL) 10 MG tablet TAKE ONE TABLET BY MOUTH AT BEDTIME AS NEEDED FOR MUSCLE SPASMS 30 tablet 3  . LORazepam (ATIVAN) 1 MG tablet TAKE ONE-HALF TO ONE TABLET BY MOUTH TWICE DAILY AS NEEDED FOR ANXIETY 30 tablet 3    No results found for this or any previous visit (from the past 48 hour(s)). No results found.  ROS  Blood pressure 101/68, pulse (!) 58, temperature 98.4 F (36.9 C), temperature source Oral, resp. rate 18, height 5\' 8"  (1.727 m), weight 210 lb (95.3 kg), last menstrual period 07/10/2016, SpO2 100 %. Physical Exam  Constitutional: She appears well-developed and well-nourished.  HENT:  Mouth/Throat: Oropharynx is clear and moist.  Eyes: Conjunctivae are normal. No scleral icterus.  Neck: No thyromegaly present.  Cardiovascular: Normal rate, regular rhythm and normal heart sounds.   No murmur heard. Respiratory: Effort normal and breath sounds normal.  GI: Soft. She exhibits no distension and no mass. There is no tenderness.  Musculoskeletal: She exhibits no edema.  Lymphadenopathy:    She has no cervical adenopathy.  Neurological: She is alert.  Skin: Skin is warm and dry.     Assessment/Plan High risk screening colonoscopy.  Hildred Laser, MD 07/25/2016, 10:34 AM

## 2016-07-25 NOTE — Discharge Instructions (Signed)
Colonoscopy, Adult, Care After This sheet gives you information about how to care for yourself after your procedure. Your doctor may also give you more specific instructions. If you have problems or questions, call your doctor. Follow these instructions at home: General instructions   For the first 24 hours after the procedure: ? Do not drive or use machinery. ? Do not sign important documents. ? Do not drink alcohol. ? Do your daily activities more slowly than normal. ? Eat foods that are soft and easy to digest. ? Rest often.  Take over-the-counter or prescription medicines only as told by your doctor.  It is up to you to get the results of your procedure. Ask your doctor, or the department performing the procedure, when your results will be ready. To help cramping and bloating:  Try walking around.  Put heat on your belly (abdomen) as told by your doctor. Use a heat source that your doctor recommends, such as a moist heat pack or a heating pad. ? Put a towel between your skin and the heat source. ? Leave the heat on for 20-30 minutes. ? Remove the heat if your skin turns bright red. This is especially important if you cannot feel pain, heat, or cold. You can get burned. Eating and drinking  Drink enough fluid to keep your pee (urine) clear or pale yellow.  Return to your normal diet as told by your doctor. Avoid heavy or fried foods that are hard to digest.  Avoid drinking alcohol for as long as told by your doctor. Contact a doctor if:  You have blood in your poop (stool) 2-3 days after the procedure. Get help right away if:  You have more than a small amount of blood in your poop.  You see large clumps of tissue (blood clots) in your poop.  Your belly is swollen.  You feel sick to your stomach (nauseous).  You throw up (vomit).  You have a fever.  You have belly pain that gets worse, and medicine does not help your pain. This information is not intended to  replace advice given to you by your health care provider. Make sure you discuss any questions you have with your health care provider. Document Released: 02/23/2010 Document Revised: 10/16/2015 Document Reviewed: 10/16/2015 Elsevier Interactive Patient Education  2017 [place] usual medications and high fiber diet. No driving for 24 hours. Physician will call with biopsy results. Next colonoscopy in 5 years.

## 2016-07-25 NOTE — Op Note (Signed)
[facility] Patient Name: Valerie Elliott Procedure Date: 07/25/2016 10:12 AM MRN: [id number] Date of Birth: [birth year] Attending MD: Hildred Laser , MD CSN: [id number] Age: 51 Admit Type: Outpatient Procedure:                Colonoscopy Indications:              Screening in patient at increased risk: Colorectal                            cancer in sister before age 23 Providers:                Hildred Laser, MD, Otis Peak B. Sharon Seller, RN, Aram Candela Referring MD:             Elayne Snare. Wolfgang Phoenix, MD Medicines:                Meperidine 50 mg IV, Midazolam 7 mg IV Complications:            No immediate complications. Estimated Blood Loss:     Estimated blood loss was minimal. Procedure:                Pre-Anesthesia Assessment:                           - Prior to the procedure, a History and Physical                            was performed, and patient medications and                            allergies were reviewed. The patient's tolerance of                            previous anesthesia was also reviewed. The risks                            and benefits of the procedure and the sedation                            options and risks were discussed with the patient.                            All questions were answered, and informed consent                            was obtained. Prior Anticoagulants: The patient                            last took ibuprofen 4 days prior to the procedure.                            ASA Grade Assessment: II - A patient with mild  systemic disease. After reviewing the risks and                            benefits, the patient was deemed in satisfactory                            condition to undergo the procedure.                           After obtaining informed consent, the colonoscope                            was passed under direct vision. Throughout the                             procedure, the patient's blood pressure, pulse, and                            oxygen saturations were monitored continuously. The                            [id number] ([id number]) was introduced through the                            anus and advanced to the the cecum, identified by                            appendiceal orifice and ileocecal valve. The                            colonoscopy was performed without difficulty. The                            patient tolerated the procedure well. The quality                            of the bowel preparation was good. The ileocecal                            valve, appendiceal orifice, and rectum were                            photographed. Scope In: 10:44:55 AM Scope Out: 11:02:25 AM Scope Withdrawal Time: 0 hours 12 minutes 26 seconds  Total Procedure Duration: 0 hours 17 minutes 30 seconds  Findings:      The perianal and digital rectal examinations were normal.      Two sessile polyps were found in the sigmoid colon and cecum. The polyps       were diminutive in size. These were biopsied with a cold forceps for       histology. The pathology specimen was placed into Bottle Number 1.      Scattered medium-mouthed diverticula were found in the sigmoid colon.      External and internal hemorrhoids were found during retroflexion. The  hemorrhoids were small. Impression:               - Two diminutive polyps in the sigmoid colon and in                            the cecum. Biopsied.                           - Diverticulosis in the sigmoid colon.                           - External and internal hemorrhoids. Moderate Sedation:      Moderate (conscious) sedation was administered by the endoscopy nurse       and supervised by the endoscopist. The following parameters were       monitored: oxygen saturation, heart rate, blood pressure, CO2       capnography and response to care. Total physician intraservice time was       23  minutes. Recommendation:           - Patient has a contact number available for                            emergencies. The signs and symptoms of potential                            delayed complications were discussed with the                            patient. Return to normal activities tomorrow.                            Written discharge instructions were provided to the                            patient.                           - High fiber diet today.                           - Continue present medications.                           - Await pathology results.                           - Repeat colonoscopy in 5 years for screening                            purposes. Procedure Code(s):        --- Professional ---                           [phone], Colonoscopy, flexible; with biopsy, single                            or multiple  40347, Moderate sedation services provided by the                            same physician or other qualified health care                            professional performing the diagnostic or                            therapeutic service that the sedation supports,                            requiring the presence of an independent trained                            observer to assist in the monitoring of the                            patient's level of consciousness and physiological                            status; initial 15 minutes of intraservice time,                            patient age 68 years or older                           [phone], Moderate sedation services; each additional                            15 minutes intraservice time Diagnosis Code(s):        --- Professional ---                           Z80.0, Family history of malignant neoplasm of                            digestive organs                           D12.5, Benign neoplasm of sigmoid colon                           D12.0, Benign neoplasm of  cecum                           K64.8, Other hemorrhoids                           K57.30, Diverticulosis of large intestine without                            perforation or abscess without bleeding CPT copyright 2016 American Medical Association. All rights reserved. The codes documented in this report are preliminary and upon coder review may  be revised to meet current compliance requirements. [employer]  Laural Golden, MD Hildred Laser, MD 07/25/2016 11:10:38 AM This report has been signed electronically. Number of Addenda: 0

## 2016-07-30 ENCOUNTER — Encounter (HOSPITAL_COMMUNITY): Payer: Self-pay | Admitting: Internal Medicine

## 2016-09-27 ENCOUNTER — Encounter: Payer: Self-pay | Admitting: Family Medicine

## 2016-09-27 ENCOUNTER — Ambulatory Visit (INDEPENDENT_AMBULATORY_CARE_PROVIDER_SITE_OTHER): Payer: BLUE CROSS/BLUE SHIELD | Admitting: Family Medicine

## 2016-09-27 VITALS — BP 114/80 | Ht 68.0 in | Wt 212.0 lb

## 2016-09-27 DIAGNOSIS — F321 Major depressive disorder, single episode, moderate: Secondary | ICD-10-CM

## 2016-09-27 DIAGNOSIS — I10 Essential (primary) hypertension: Secondary | ICD-10-CM | POA: Diagnosis not present

## 2016-09-27 DIAGNOSIS — L659 Nonscarring hair loss, unspecified: Secondary | ICD-10-CM | POA: Diagnosis not present

## 2016-09-27 MED ORDER — LORAZEPAM 1 MG PO TABS: ORAL_TABLET | ORAL | 3 refills | Status: DC

## 2016-09-27 MED ORDER — INDAPAMIDE 1.25 MG PO TABS: 1.2500 mg | ORAL_TABLET | Freq: Every morning | ORAL | 1 refills | Status: DC

## 2016-09-27 MED ORDER — LISINOPRIL 5 MG PO TABS: 5.0000 mg | ORAL_TABLET | Freq: Every morning | ORAL | 1 refills | Status: DC

## 2016-09-27 MED ORDER — SERTRALINE HCL 50 MG PO TABS: 50.0000 mg | ORAL_TABLET | Freq: Every day | ORAL | 3 refills | Status: DC

## 2016-09-27 MED ORDER — LEVETIRACETAM 500 MG PO TABS: 500.0000 mg | ORAL_TABLET | Freq: Two times a day (BID) | ORAL | 1 refills | Status: DC

## 2016-09-27 MED ORDER — FLUTICASONE PROPIONATE 50 MCG/ACT NA SUSP: 2.0000 | Freq: Every day | NASAL | 6 refills | Status: DC

## 2016-09-27 NOTE — Progress Notes (Signed)
Labs ordered 09/27/16

## 2016-09-27 NOTE — Addendum Note (Signed)
Addended by: Launa Grill on: 09/27/2016 01:35 PM   Modules accepted: Orders

## 2016-09-27 NOTE — Progress Notes (Signed)
   Subjective:    Patient ID: Valerie Elliott, female    DOB: September 26, 1965, 51 y.o.   MRN: [id number]  Anxiety  Presents for follow-up visit. Symptoms include depressed mood. Primary symptoms comment: pt's sister recently passed away. The quality of sleep is poor.    pt stopped taking lorazepam. Felt she did not need them.  Patient also states at times she is been losing her hair She did fill out fill out of anxiety and a depression questionnaire She denies being suicidal She's gone through multiple losses over the past few years in the 1 month ago she lost her sister to colon cancer.   Review of Systems She denies chest pain shortness of breath she does relate anxiousness tearfulness feeling down fatigue tiredness not suicidal States she's been losing her hair intermittently over the past month    Objective:   Physical Exam  Lungs clear heart regular pulse normal BP good 15 minutes spent with patient greater than half in discussion     Assessment & Plan:  Refills on blood pressure medicine Major depression Zoloft prescribed warning signs regarding worsening depression discussed follow-up if becoming suicidal immediately Otherwise recheck 3-4 weeks If patient has significant side effects of medication notifies Lorazepam for home use only only when feeling overwhelmed caution drowsiness not for frequent use

## 2016-10-04 DIAGNOSIS — I10 Essential (primary) hypertension: Secondary | ICD-10-CM | POA: Diagnosis not present

## 2016-10-04 DIAGNOSIS — L659 Nonscarring hair loss, unspecified: Secondary | ICD-10-CM | POA: Diagnosis not present

## 2016-10-05 LAB — BASIC METABOLIC PANEL
BUN / CREAT RATIO: 10 (ref 9–23)
BUN: 9 mg/dL (ref 6–24)
CO2: 26 mmol/L (ref 20–29)
CREATININE: 0.94 mg/dL (ref 0.57–1.00)
Calcium: 9.4 mg/dL (ref 8.7–10.2)
Chloride: 99 mmol/L (ref 96–106)
GFR calc Af Amer: 82 mL/min/{1.73_m2} (ref >59)
GFR, EST NON AFRICAN AMERICAN: 71 mL/min/{1.73_m2} (ref >59)
Glucose: 98 mg/dL (ref 65–99)
Potassium: 4 mmol/L (ref 3.5–5.2)
SODIUM: 139 mmol/L (ref 134–144)

## 2016-10-05 LAB — T4, FREE: Free T4: 1.11 ng/dL (ref 0.82–1.77)

## 2016-10-05 LAB — TSH: TSH: 2.19 u[IU]/mL (ref 0.450–4.500)

## 2016-12-03 ENCOUNTER — Encounter: Payer: Self-pay | Admitting: Family Medicine

## 2016-12-24 ENCOUNTER — Encounter: Payer: Self-pay | Admitting: Family Medicine

## 2016-12-24 NOTE — Telephone Encounter (Signed)
Pt called to request referral to dermatology for her hair falling out Near the end of the conversation pt mentioned that she'd sent this request via MyChart earlier  Please advise  Pt would like to see Dr. Tarri Glenn in [place] (he requires a referral)

## 2016-12-24 NOTE — Telephone Encounter (Signed)
Please go ahead with dermatology consult because of hair falling out- please send referral information to [facility].  Please inform the patient that the consult was put in and she will hear something from them but it often takes several weeks to get in with dermatology(you may inform her via my chart)

## 2016-12-27 ENCOUNTER — Telehealth: Payer: Self-pay | Admitting: Family Medicine

## 2016-12-27 DIAGNOSIS — L659 Nonscarring hair loss, unspecified: Secondary | ICD-10-CM

## 2016-12-27 NOTE — Telephone Encounter (Signed)
The patient had a my chart request for dermatology referral because her hair is falling out please put in for urgent referral to dermatology-see my chart message for further details

## 2016-12-27 NOTE — Telephone Encounter (Signed)
Referral ordered in EPIC. 

## 2016-12-27 NOTE — Addendum Note (Signed)
Addended by: Dairl Ponder on: 12/27/2016 01:15 PM   Modules accepted: Orders

## 2017-01-01 DIAGNOSIS — L659 Nonscarring hair loss, unspecified: Secondary | ICD-10-CM | POA: Diagnosis not present

## 2017-01-01 DIAGNOSIS — D485 Neoplasm of uncertain behavior of skin: Secondary | ICD-10-CM | POA: Diagnosis not present

## 2017-01-07 DIAGNOSIS — M549 Dorsalgia, unspecified: Secondary | ICD-10-CM | POA: Diagnosis not present

## 2017-01-07 DIAGNOSIS — Z6831 Body mass index (BMI) 31.0-31.9, adult: Secondary | ICD-10-CM | POA: Diagnosis not present

## 2017-01-07 DIAGNOSIS — R319 Hematuria, unspecified: Secondary | ICD-10-CM | POA: Diagnosis not present

## 2017-01-07 DIAGNOSIS — N898 Other specified noninflammatory disorders of vagina: Secondary | ICD-10-CM | POA: Diagnosis not present

## 2017-01-08 DIAGNOSIS — L659 Nonscarring hair loss, unspecified: Secondary | ICD-10-CM | POA: Diagnosis not present

## 2017-01-16 DIAGNOSIS — Z029 Encounter for administrative examinations, unspecified: Secondary | ICD-10-CM

## 2017-01-17 ENCOUNTER — Other Ambulatory Visit: Payer: Self-pay | Admitting: Family Medicine

## 2017-02-07 ENCOUNTER — Telehealth: Payer: Self-pay

## 2017-02-07 NOTE — Telephone Encounter (Signed)
Patient's pharmacy is calling asking to refill Cyclobenzaprine 10 mg one po prn muscle spasms.

## 2017-02-07 NOTE — Telephone Encounter (Signed)
It is not currently on her medicine list I need to know from the patient how often she uses this and when she uses it this medicine can cause significant drowsiness-Please handle on the week of January 7

## 2017-02-10 ENCOUNTER — Other Ambulatory Visit: Payer: Self-pay | Admitting: *Deleted

## 2017-02-10 MED ORDER — CYCLOBENZAPRINE HCL 10 MG PO TABS: 10.0000 mg | ORAL_TABLET | Freq: Every day | ORAL | 2 refills | Status: DC

## 2017-02-10 NOTE — Telephone Encounter (Signed)
Flexeril 10 mg, 1 nightly as needed back pain, cautioned drowsiness, #30, 2 refills

## 2017-02-10 NOTE — Telephone Encounter (Signed)
Left message to return call 

## 2017-02-10 NOTE — Telephone Encounter (Signed)
Pt states she takes for back pain. Only takes it at night if needed. [facility] [place]. States she had this prescribed by urgent care and by dr scott.

## 2017-02-10 NOTE — Telephone Encounter (Signed)
Med sent to pharm. Pt notified.  

## 2017-03-03 ENCOUNTER — Other Ambulatory Visit: Payer: Self-pay | Admitting: Nurse Practitioner

## 2017-03-31 ENCOUNTER — Encounter: Payer: Self-pay | Admitting: Nurse Practitioner

## 2017-03-31 ENCOUNTER — Other Ambulatory Visit: Payer: Self-pay | Admitting: Nurse Practitioner

## 2017-04-01 ENCOUNTER — Telehealth: Payer: Self-pay | Admitting: Family Medicine

## 2017-04-01 DIAGNOSIS — I10 Essential (primary) hypertension: Secondary | ICD-10-CM

## 2017-04-01 DIAGNOSIS — Z13220 Encounter for screening for lipoid disorders: Secondary | ICD-10-CM

## 2017-04-01 DIAGNOSIS — Z79899 Other long term (current) drug therapy: Secondary | ICD-10-CM

## 2017-04-01 DIAGNOSIS — E559 Vitamin D deficiency, unspecified: Secondary | ICD-10-CM

## 2017-04-01 NOTE — Telephone Encounter (Signed)
Patient needing labs done has physical 3/4

## 2017-04-01 NOTE — Telephone Encounter (Signed)
Metabolic 7, lipid, liver, vitamin D-hyperlipidemia, hypertension, vitamin D deficiency

## 2017-04-01 NOTE — Telephone Encounter (Signed)
Last labs were TSH T4 Free and BMP on 10/04/2017

## 2017-04-02 NOTE — Telephone Encounter (Signed)
Patient is aware 

## 2017-04-04 ENCOUNTER — Encounter: Payer: Self-pay | Admitting: Nurse Practitioner

## 2017-04-04 NOTE — Telephone Encounter (Signed)
ERROR

## 2017-04-05 DIAGNOSIS — Z79899 Other long term (current) drug therapy: Secondary | ICD-10-CM | POA: Diagnosis not present

## 2017-04-05 DIAGNOSIS — E559 Vitamin D deficiency, unspecified: Secondary | ICD-10-CM | POA: Diagnosis not present

## 2017-04-05 DIAGNOSIS — Z13220 Encounter for screening for lipoid disorders: Secondary | ICD-10-CM | POA: Diagnosis not present

## 2017-04-05 DIAGNOSIS — I10 Essential (primary) hypertension: Secondary | ICD-10-CM | POA: Diagnosis not present

## 2017-04-07 ENCOUNTER — Encounter: Payer: Self-pay | Admitting: Nurse Practitioner

## 2017-04-07 ENCOUNTER — Ambulatory Visit: Payer: BLUE CROSS/BLUE SHIELD | Admitting: Nurse Practitioner

## 2017-04-07 VITALS — BP 114/72 | Temp 98.8°F | Ht 68.75 in | Wt 221.0 lb

## 2017-04-07 DIAGNOSIS — Z1231 Encounter for screening mammogram for malignant neoplasm of breast: Secondary | ICD-10-CM | POA: Diagnosis not present

## 2017-04-07 DIAGNOSIS — F419 Anxiety disorder, unspecified: Secondary | ICD-10-CM

## 2017-04-07 DIAGNOSIS — F329 Major depressive disorder, single episode, unspecified: Secondary | ICD-10-CM | POA: Diagnosis not present

## 2017-04-07 DIAGNOSIS — F32A Depression, unspecified: Secondary | ICD-10-CM

## 2017-04-07 DIAGNOSIS — Z0000 Encounter for general adult medical examination without abnormal findings: Secondary | ICD-10-CM | POA: Diagnosis not present

## 2017-04-07 LAB — BASIC METABOLIC PANEL
BUN / CREAT RATIO: 15 (ref 9–23)
BUN: 16 mg/dL (ref 6–24)
CHLORIDE: 100 mmol/L (ref 96–106)
CO2: 26 mmol/L (ref 20–29)
CREATININE: 1.07 mg/dL — AB (ref 0.57–1.00)
Calcium: 9.4 mg/dL (ref 8.7–10.2)
GFR calc Af Amer: 69 mL/min/{1.73_m2} (ref >59)
GFR calc non Af Amer: 60 mL/min/{1.73_m2} (ref >59)
GLUCOSE: 85 mg/dL (ref 65–99)
Potassium: 4.4 mmol/L (ref 3.5–5.2)
SODIUM: 140 mmol/L (ref 134–144)

## 2017-04-07 LAB — HEPATIC FUNCTION PANEL
ALBUMIN: 4.2 g/dL (ref 3.5–5.5)
ALK PHOS: 73 IU/L (ref 39–117)
ALT: 10 IU/L (ref 0–32)
AST: 14 IU/L (ref 0–40)
Bilirubin Total: 0.6 mg/dL (ref 0.0–1.2)
Bilirubin, Direct: 0.12 mg/dL (ref 0.00–0.40)
TOTAL PROTEIN: 7 g/dL (ref 6.0–8.5)

## 2017-04-07 LAB — LIPID PANEL
CHOL/HDL RATIO: 4.5 ratio — AB (ref 0.0–4.4)
CHOLESTEROL TOTAL: 220 mg/dL — AB (ref 100–199)
HDL: 49 mg/dL (ref >39)
LDL CALC: 149 mg/dL — AB (ref 0–99)
TRIGLYCERIDES: 111 mg/dL (ref 0–149)
VLDL CHOLESTEROL CAL: 22 mg/dL (ref 5–40)

## 2017-04-07 LAB — VITAMIN D 25 HYDROXY (VIT D DEFICIENCY, FRACTURES): Vit D, 25-Hydroxy: 30.3 ng/mL (ref 30.0–100.0)

## 2017-04-07 MED ORDER — SERTRALINE HCL 100 MG PO TABS: 100.0000 mg | ORAL_TABLET | Freq: Every day | ORAL | 1 refills | Status: DC

## 2017-04-07 NOTE — Patient Instructions (Signed)
Increase Zoloft to 2 per day (100 mg)

## 2017-04-09 ENCOUNTER — Other Ambulatory Visit: Payer: Self-pay | Admitting: Nurse Practitioner

## 2017-04-09 ENCOUNTER — Encounter: Payer: Self-pay | Admitting: Nurse Practitioner

## 2017-04-09 DIAGNOSIS — F419 Anxiety disorder, unspecified: Secondary | ICD-10-CM

## 2017-04-09 DIAGNOSIS — F32A Depression, unspecified: Secondary | ICD-10-CM | POA: Insufficient documentation

## 2017-04-09 DIAGNOSIS — F329 Major depressive disorder, single episode, unspecified: Secondary | ICD-10-CM | POA: Insufficient documentation

## 2017-04-09 NOTE — Progress Notes (Signed)
Subjective:    Patient ID: Valerie Elliott, female    DOB: [birth year], 52 y.o.   MRN: [id number]  HPI presents for her wellness exam. Fairly healthy diet. Has been under tremendous stress lately. Has lost her mother, sister and a brother. Has another brother that is dying from cancer. Zoloft helping some but struggling. Regular cycles, slightly heavy flow lasting 4-5 days. Same sexual partner. Needs vision and dental exams. Has been trying to do some walking. Has a family history of colon cancer. Has had a colonoscopy.  Depression screen PHQ 2/9 09/27/2016  Decreased Interest 1  Down, Depressed, Hopeless 3  PHQ - 2 Score 4  Altered sleeping 3  Tired, decreased energy 1  Change in appetite 1  Feeling bad or failure about yourself  0  Trouble concentrating 1  Moving slowly or fidgety/restless 3  Suicidal thoughts 0  PHQ-9 Score 13  Difficult doing work/chores Somewhat difficult       Review of Systems  Constitutional: Positive for fatigue. Negative for activity change and appetite change.  HENT: Negative for dental problem, ear pain, sinus pressure and sore throat.   Respiratory: Negative for cough, chest tightness, shortness of breath and wheezing.   Cardiovascular: Negative for chest pain.  Gastrointestinal: Negative for abdominal distention, abdominal pain, blood in stool, constipation, diarrhea, nausea and vomiting.  Genitourinary: Negative for difficulty urinating, dysuria, enuresis, frequency, genital sores, menstrual problem, pelvic pain, urgency and vaginal discharge.  Psychiatric/Behavioral: Positive for dysphoric mood and sleep disturbance. Negative for suicidal ideas. The patient is nervous/anxious.        Objective:   Physical Exam  Constitutional: She is oriented to person, place, and time. She appears well-developed. No distress.  HENT:  Right Ear: External ear normal.  Left Ear: External ear normal.  Mouth/Throat: Oropharynx is clear and moist.  Neck: Normal  range of motion. Neck supple. No tracheal deviation present. No thyromegaly present.  Cardiovascular: Normal rate, regular rhythm and normal heart sounds. Exam reveals no gallop.  No murmur heard. Pulmonary/Chest: Effort normal and breath sounds normal. Right breast exhibits no inverted nipple, no mass, no skin change and no tenderness. Left breast exhibits no inverted nipple, no mass, no skin change and no tenderness. Breasts are symmetrical.  Areas of dense tissue. Axillae no adenopathy.   Abdominal: Soft. She exhibits no distension. There is no tenderness.  Genitourinary: Vagina normal and uterus normal. No vaginal discharge found.  Genitourinary Comments: External GU: no rashes or lesions. Vagina: no discharge. Cervix normal in appearance. No CMT. Bimanual exam: no tenderness or obvious masses.   Musculoskeletal: She exhibits no edema.  Lymphadenopathy:    She has no cervical adenopathy.  Neurological: She is alert and oriented to person, place, and time.  Skin: Skin is warm and dry. No rash noted.  Psychiatric: She has a normal mood and affect. Her behavior is normal. Thought content normal.  Crying at times during visit.   Vitals reviewed.         Assessment & Plan:   Problem List Items Addressed This Visit      Other   Anxiety and depression   Relevant Medications   sertraline (ZOLOFT) 100 MG tablet    Other Visit Diagnoses    Routine general medical examination at a health care facility    -  Primary   Encounter for screening mammogram for breast cancer       Relevant Orders   MM DIGITAL SCREENING BILATERAL  Increase Zoloft to 100 mg. Go back to previous dose if any problems. Discussed importance of stress reduction.  Return in about 6 months (around 10/08/2017) for recheck. Call back sooner if any problems. Defers counseling at this time.

## 2017-04-10 ENCOUNTER — Ambulatory Visit (HOSPITAL_COMMUNITY)
Admission: RE | Admit: 2017-04-10 | Discharge: 2017-04-10 | Disposition: A | Discharge status: Home / Self Care | Payer: BLUE CROSS/BLUE SHIELD | Source: Ambulatory Visit | Attending: Nurse Practitioner | Admitting: Nurse Practitioner

## 2017-04-10 DIAGNOSIS — Z1231 Encounter for screening mammogram for malignant neoplasm of breast: Secondary | ICD-10-CM | POA: Insufficient documentation

## 2017-04-17 ENCOUNTER — Other Ambulatory Visit: Payer: Self-pay | Admitting: Family Medicine

## 2017-04-29 ENCOUNTER — Encounter: Payer: Self-pay | Admitting: Nurse Practitioner

## 2017-04-29 ENCOUNTER — Other Ambulatory Visit: Payer: Self-pay | Admitting: Nurse Practitioner

## 2017-04-29 MED ORDER — LOSARTAN POTASSIUM 50 MG PO TABS: 50.0000 mg | ORAL_TABLET | Freq: Every day | ORAL | 0 refills | Status: DC

## 2017-06-05 ENCOUNTER — Other Ambulatory Visit: Payer: Self-pay | Admitting: Family Medicine

## 2017-06-05 NOTE — Telephone Encounter (Signed)
May have this +1 additional refill 

## 2017-06-17 ENCOUNTER — Ambulatory Visit: Payer: BLUE CROSS/BLUE SHIELD | Admitting: Family Medicine

## 2017-06-17 VITALS — BP 120/80 | Ht 68.75 in | Wt 220.6 lb

## 2017-06-17 DIAGNOSIS — F321 Major depressive disorder, single episode, moderate: Secondary | ICD-10-CM

## 2017-06-17 DIAGNOSIS — R569 Unspecified convulsions: Secondary | ICD-10-CM | POA: Diagnosis not present

## 2017-06-17 MED ORDER — LOSARTAN POTASSIUM 50 MG PO TABS: 50.0000 mg | ORAL_TABLET | Freq: Every day | ORAL | 0 refills | Status: DC

## 2017-06-17 MED ORDER — SERTRALINE HCL 100 MG PO TABS: ORAL_TABLET | ORAL | 1 refills | Status: DC

## 2017-06-17 MED ORDER — LEVETIRACETAM 500 MG PO TABS: 500.0000 mg | ORAL_TABLET | Freq: Two times a day (BID) | ORAL | 1 refills | Status: DC

## 2017-06-17 NOTE — Progress Notes (Signed)
   Subjective:    Patient ID: Valerie Elliott, female    DOB: Mar 18, 1965, 52 y.o.   MRN: [id number]  HPI Patient arrives to discuss some issues she is having. She is having some staring spells where she stares off into space a few seconds at a time and feels like something is causing her to zone out temporarily she denies any tremors numbness tingling denies any corrects or falls.  She has noticed this more than anyone else.  She is also under a lot of stress had recent loss in the family of her brother who died of colon cancer patient has been dealing with depression sadness crying spells  Review of Systems  Constitutional: Negative for activity change, appetite change and fatigue.  HENT: Negative for congestion.   Respiratory: Negative for cough.   Cardiovascular: Negative for chest pain.  Gastrointestinal: Negative for abdominal pain.  Endocrine: Negative for polydipsia and polyphagia.  Skin: Negative for color change.  Neurological: Negative for weakness.  Psychiatric/Behavioral: Negative for confusion.       Objective:   Physical Exam Lungs are clear no crackles heart is regular no murmurs extremities no edema skin warm dry affect tearful but logical and coherent not suicidal  It is possible stress may be causing this reaction but she will need to be seen by neurology for further evaluation including probable EEG I think it is fine for the patient to drive currently     Assessment & Plan:  Moderate depression Contributed by grief Not suicidal Increase Zoloft 1-1/2 tablet daily Follow-up if progressive troubles Recheck if any problems recheck here in 3 weeks time   Certainly if progressive symptoms or if feeling suicidal immediately be rechecked.  We will be setting her up with neurology.  Patient to continue Keppra for now Cannot rule out the possibility of absence seizure's HEENT benign neck no masses no deviation of the trachea

## 2017-06-17 NOTE — Patient Instructions (Signed)
Increase Keppra to one twice a day  We will set up appointment with Neurology  Also increase sertraline to 1 and a half daily  We will set up referral to counselor

## 2017-06-19 ENCOUNTER — Other Ambulatory Visit: Payer: Self-pay | Admitting: Family Medicine

## 2017-06-19 DIAGNOSIS — G40909 Epilepsy, unspecified, not intractable, without status epilepticus: Secondary | ICD-10-CM

## 2017-06-19 NOTE — Progress Notes (Signed)
Referral placed.

## 2017-06-19 NOTE — Progress Notes (Unsigned)
am

## 2017-06-23 ENCOUNTER — Encounter: Payer: Self-pay | Admitting: Family Medicine

## 2017-06-23 ENCOUNTER — Encounter (INDEPENDENT_AMBULATORY_CARE_PROVIDER_SITE_OTHER): Payer: Self-pay

## 2017-07-03 ENCOUNTER — Encounter: Payer: Self-pay | Admitting: Neurology

## 2017-07-09 ENCOUNTER — Ambulatory Visit: Payer: BLUE CROSS/BLUE SHIELD | Admitting: Family Medicine

## 2017-09-15 ENCOUNTER — Encounter: Payer: Self-pay | Admitting: Neurology

## 2017-09-15 ENCOUNTER — Ambulatory Visit: Payer: BLUE CROSS/BLUE SHIELD | Admitting: Neurology

## 2017-09-15 ENCOUNTER — Other Ambulatory Visit: Payer: Self-pay

## 2017-09-15 VITALS — BP 114/86 | HR 63 | Ht 70.0 in | Wt 222.0 lb

## 2017-09-15 DIAGNOSIS — G4486 Cervicogenic headache: Secondary | ICD-10-CM

## 2017-09-15 DIAGNOSIS — G40009 Localization-related (focal) (partial) idiopathic epilepsy and epileptic syndromes with seizures of localized onset, not intractable, without status epilepticus: Secondary | ICD-10-CM | POA: Diagnosis not present

## 2017-09-15 DIAGNOSIS — R51 Headache: Secondary | ICD-10-CM

## 2017-09-15 DIAGNOSIS — M542 Cervicalgia: Secondary | ICD-10-CM

## 2017-09-15 MED ORDER — CYCLOBENZAPRINE HCL 10 MG PO TABS: ORAL_TABLET | ORAL | 5 refills | Status: DC

## 2017-09-15 MED ORDER — LEVETIRACETAM 500 MG PO TABS: 500.0000 mg | ORAL_TABLET | Freq: Two times a day (BID) | ORAL | 3 refills | Status: DC
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
## 2017-09-18 ENCOUNTER — Ambulatory Visit (INDEPENDENT_AMBULATORY_CARE_PROVIDER_SITE_OTHER): Payer: BLUE CROSS/BLUE SHIELD | Admitting: Neurology

## 2017-09-18 DIAGNOSIS — G40009 Localization-related (focal) (partial) idiopathic epilepsy and epileptic syndromes with seizures of localized onset, not intractable, without status epilepticus: Secondary | ICD-10-CM

## 2017-09-18 DIAGNOSIS — M542 Cervicalgia: Secondary | ICD-10-CM | POA: Diagnosis not present

## 2017-09-18 DIAGNOSIS — R51 Headache: Secondary | ICD-10-CM

## 2017-09-18 DIAGNOSIS — G4486 Cervicogenic headache: Secondary | ICD-10-CM

## 2017-09-25 NOTE — Procedures (Signed)
ELECTROENCEPHALOGRAM REPORT  Date of Study: 09/18/2017  Patient's Name: Valerie Elliott MRN: [id number] Date of Birth: 07-02-1965  Referring Provider: Dr. Ellouise Newer  Clinical History: This is a 52 year old woman with seizures with impaired awareness. EEG for classification.  Medications: KEPPRA 500 MG tablet  ATIVAN 1 MG tablet  Biotin 1000 MCG tablet FLEXERIL 10 MG tablet  BENADRYL cream FLONASE 50 MCG/ACT nasal spray ADVIL,MOTRIN 200 MG tablet  LOZOL 1.25 MG tablet  COZAAR 50 MG tablet  ADIPEX-P 37.5 MG tablet  ZOLOFT 100 MG tablet  Technical Summary: A multichannel digital 1-hour sleep-deprived EEG recording measured by the international 10-20 system with electrodes applied with paste and impedances below 5000 ohms performed in our laboratory with EKG monitoring in an awake and asleep patient.  Hyperventilation and photic stimulation were performed.  The digital EEG was referentially recorded, reformatted, and digitally filtered in a variety of bipolar and referential montages for optimal display.    Description: The patient is awake and asleep during the recording.  During maximal wakefulness, there is a symmetric, medium voltage 9 Hz posterior dominant rhythm that attenuates with eye opening.  The record is symmetric.  During drowsiness and sleep, there is an increase in theta slowing of the background.  Vertex waves and symmetric sleep spindles were seen.  Hyperventilation and photic stimulation did not elicit any abnormalities.  There were no epileptiform discharges or electrographic seizures seen.    EKG lead showed sinus bradycardia at 54 bpm.  Impression: This 1-hour awake and asleep EEG is normal.    Clinical Correlation: A normal EEG does not exclude a clinical diagnosis of epilepsy.  If further clinical questions remain, prolonged EEG may be helpful.  Clinical correlation is advised.   Ellouise Newer, M.D.

## 2017-10-02 ENCOUNTER — Other Ambulatory Visit: Payer: BLUE CROSS/BLUE SHIELD

## 2017-10-21 ENCOUNTER — Other Ambulatory Visit: Payer: Self-pay | Admitting: Family Medicine

## 2017-10-22 ENCOUNTER — Other Ambulatory Visit: Payer: Self-pay

## 2017-10-22 MED ORDER — INDAPAMIDE 1.25 MG PO TABS: 1.2500 mg | ORAL_TABLET | Freq: Every morning | ORAL | 1 refills | Status: DC

## 2017-11-26 ENCOUNTER — Encounter: Payer: Self-pay | Admitting: Family Medicine

## 2017-11-26 NOTE — Telephone Encounter (Signed)
Nurses Please connect with patient We can try doxycycline 100 mg 1 daily #30 with 3 refills This is an antibiotic that is used for acne If she has ongoing troubles she may need to get back in with dermatology

## 2017-11-27 ENCOUNTER — Other Ambulatory Visit: Payer: Self-pay | Admitting: Family Medicine

## 2017-11-27 MED ORDER — DOXYCYCLINE HYCLATE 100 MG PO TABS: ORAL_TABLET | ORAL | 3 refills | Status: DC

## 2018-01-03 ENCOUNTER — Other Ambulatory Visit: Payer: Self-pay | Admitting: Family Medicine

## 2018-01-06 NOTE — Telephone Encounter (Signed)
May have 1 refill on each needs office visit

## 2018-01-06 NOTE — Telephone Encounter (Signed)
May have 1 refill needs follow-up office visit

## 2018-01-20 ENCOUNTER — Ambulatory Visit: Payer: BLUE CROSS/BLUE SHIELD | Admitting: Family Medicine

## 2018-01-20 ENCOUNTER — Encounter: Payer: Self-pay | Admitting: Family Medicine

## 2018-01-20 VITALS — BP 118/84 | Temp 98.6°F | Ht 68.75 in | Wt 226.0 lb

## 2018-01-20 DIAGNOSIS — N898 Other specified noninflammatory disorders of vagina: Secondary | ICD-10-CM

## 2018-01-20 DIAGNOSIS — M7521 Bicipital tendinitis, right shoulder: Secondary | ICD-10-CM | POA: Diagnosis not present

## 2018-01-20 DIAGNOSIS — B9689 Other specified bacterial agents as the cause of diseases classified elsewhere: Secondary | ICD-10-CM

## 2018-01-20 DIAGNOSIS — N760 Acute vaginitis: Secondary | ICD-10-CM

## 2018-01-20 MED ORDER — DICLOFENAC SODIUM 75 MG PO TBEC: 75.0000 mg | DELAYED_RELEASE_TABLET | Freq: Two times a day (BID) | ORAL | 0 refills | Status: DC

## 2018-01-20 MED ORDER — METRONIDAZOLE 500 MG PO TABS: 500.0000 mg | ORAL_TABLET | Freq: Two times a day (BID) | ORAL | 0 refills | Status: AC

## 2018-01-20 NOTE — Progress Notes (Signed)
Subjective:    Patient ID: Valerie Elliott, female    DOB: Aug 14, 1965, 52 y.o.   MRN: [id number]  HPI Patient is here today with complaints of a rash on her vagina and leg and a possible yeast infection. She says she noticed the rash around a month ago and has been using a pimple cream for it. She states she noticed the yeast infection last week. She bought otc vagisil it helped some.  Reports noticing pimples to backs of her legs about 1 month, medication was called in. Doxycycline was prescribed, pt has been taking daily. States bumps have gotten better, but still present. Reports noticing itching to vagina x 1.5 weeks, denies discharge.   Reports irregular cycles, perimenopausal, LMP 3 months ago. Denies sexual activity currently - last 3-4 months ago. Denies using any form of birth control, no condom use.   Also reports right bicep pain x 2 weeks, denies injury. Pain worse with certain movements. Has tried advil and heat/cold with some relief.   Review of Systems  Constitutional: Negative for fever.  Gastrointestinal: Negative for abdominal pain, nausea and vomiting.  Genitourinary: Negative for difficulty urinating, dysuria, hematuria and pelvic pain.  Musculoskeletal: Negative for joint swelling.       Objective:   Physical Exam Vitals signs and nursing note reviewed. Exam conducted with a chaperone present.  Constitutional:      General: She is not in acute distress.    Appearance: Normal appearance.  HENT:     Head: Normocephalic and atraumatic.  Cardiovascular:     Rate and Rhythm: Normal rate and regular rhythm.     Heart sounds: Normal heart sounds.  Pulmonary:     Effort: Pulmonary effort is normal. No respiratory distress.     Breath sounds: Normal breath sounds.  Abdominal:     General: Bowel sounds are normal. There is no distension.     Palpations: Abdomen is soft. There is no mass.     Tenderness: There is no abdominal tenderness.  Genitourinary:    General:  Normal vulva.     Labia:        Right: No rash, tenderness or lesion.        Left: No rash, tenderness or lesion.      Vagina: Vaginal discharge present. No tenderness, bleeding or lesions.     Cervix: Discharge present. No cervical motion tenderness, friability, erythema or cervical bleeding.     Comments: Moderate amount of thin white discharge present in vagina and on cervix. No cervical inflammation noted. No tenderness or obvious masses on bimanual exam. Exam limited d/t abdominal girth. Musculoskeletal:     Comments: Tender to palpation over right biceps, no bony tenderness noted. Strength intact. Pain to biceps with resisted flexion.   Skin:    Comments: Small areas of healing noted to backs of thighs, one on each leg, small amount of dark scar tissue noted, no purulent drainage or new pustules noted.   Neurological:     Mental Status: She is alert.    Wet prep with evidence of occasional clue cells. KOH prep, negative whiff test, no evidence of yeast noted.       Assessment & Plan:  1. BV (bacterial vaginosis) Wet prep with indication of BV, will treat with flagyl, warned against alcohol use with this medication. F/u if symptoms worsen or fail to improve.  2. Vaginal discharge - Plan: GC/Chlamydia Probe Amp Recommend testing for GC/Chlamydia, will notify of results.  3. Biceps  tendinitis of right upper extremity Discussed likely etiology of biceps tendonitis or pulled muscle. Recommend short trial of anti-inflammatory medication, warned of increased risk of ulcer development with concurrent use of SSRI medication. Warning signs discussed. Encouraged use of cool compresses and gentle stretching exercises. F/u if symptoms worsen or fail to improve.  Dr. Sallee Lange was consulted on this case and is in agreement with the above treatment plan.

## 2018-01-20 NOTE — Patient Instructions (Signed)
Bacterial Vaginosis Bacterial vaginosis is a vaginal infection that occurs when the normal balance of bacteria in the vagina is disrupted. It results from an overgrowth of certain bacteria. This is the most common vaginal infection among women ages 15-44. Because bacterial vaginosis increases your risk for STIs (sexually transmitted infections), getting treated can help reduce your risk for chlamydia, gonorrhea, herpes, and HIV (human immunodeficiency virus). Treatment is also important for preventing complications in pregnant women, because this condition can cause an early (premature) delivery. What are the causes? This condition is caused by an increase in harmful bacteria that are normally present in small amounts in the vagina. However, the reason that the condition develops is not fully understood. What increases the risk? The following factors may make you more likely to develop this condition:  Having a new sexual partner or multiple sexual partners.  Having unprotected sex.  Douching.  Having an intrauterine device (IUD).  Smoking.  Drug and alcohol abuse.  Taking certain antibiotic medicines.  Being pregnant.  You cannot get bacterial vaginosis from toilet seats, bedding, swimming pools, or contact with objects around you. What are the signs or symptoms? Symptoms of this condition include:  Grey or white vaginal discharge. The discharge can also be watery or foamy.  A fish-like odor with discharge, especially after sexual intercourse or during menstruation.  Itching in and around the vagina.  Burning or pain with urination.  Some women with bacterial vaginosis have no signs or symptoms. How is this diagnosed? This condition is diagnosed based on:  Your medical history.  A physical exam of the vagina.  Testing a sample of vaginal fluid under a microscope to look for a large amount of bad bacteria or abnormal cells. Your health care provider may use a cotton swab  or a small wooden spatula to collect the sample.  How is this treated? This condition is treated with antibiotics. These may be given as a pill, a vaginal cream, or a medicine that is put into the vagina (suppository). If the condition comes back after treatment, a second round of antibiotics may be needed. Follow these instructions at home: Medicines  Take over-the-counter and prescription medicines only as told by your health care provider.  Take or use your antibiotic as told by your health care provider. Do not stop taking or using the antibiotic even if you start to feel better. General instructions  If you have a female sexual partner, tell her that you have a vaginal infection. She should see her health care provider and be treated if she has symptoms. If you have a female sexual partner, he does not need treatment.  During treatment: ? Avoid sexual activity until you finish treatment. ? Do not douche. ? Avoid alcohol as directed by your health care provider. ? Avoid breastfeeding as directed by your health care provider.  Drink enough water and fluids to keep your urine clear or pale yellow.  Keep the area around your vagina and rectum clean. ? Wash the area daily with warm water. ? Wipe yourself from front to back after using the toilet.  Keep all follow-up visits as told by your health care provider. This is important. How is this prevented?  Do not douche.  Wash the outside of your vagina with warm water only.  Use protection when having sex. This includes latex condoms and dental dams.  Limit how many sexual partners you have. To help prevent bacterial vaginosis, it is best to have sex with just   one partner (monogamous).  Make sure you and your sexual partner are tested for STIs.  Wear cotton or cotton-lined underwear.  Avoid wearing tight pants and pantyhose, especially during summer.  Limit the amount of alcohol that you drink.  Do not use any products that  contain nicotine or tobacco, such as cigarettes and e-cigarettes. If you need help quitting, ask your health care provider.  Do not use illegal drugs. Where to find more information:  Centers for Disease Control and Prevention: www.cdc.gov/std  American Sexual Health Association (ASHA): www.ashastd.org  U.S. Department of Health and Human Services, Office on Women's Health: www.womenshealth.gov/ or https://www.womenshealth.gov/a-z-topics/bacterial-vaginosis Contact a health care provider if:  Your symptoms do not improve, even after treatment.  You have more discharge or pain when urinating.  You have a fever.  You have pain in your abdomen.  You have pain during sex.  You have vaginal bleeding between periods. Summary  Bacterial vaginosis is a vaginal infection that occurs when the normal balance of bacteria in the vagina is disrupted.  Because bacterial vaginosis increases your risk for STIs (sexually transmitted infections), getting treated can help reduce your risk for chlamydia, gonorrhea, herpes, and HIV (human immunodeficiency virus). Treatment is also important for preventing complications in pregnant women, because the condition can cause an early (premature) delivery.  This condition is treated with antibiotic medicines. These may be given as a pill, a vaginal cream, or a medicine that is put into the vagina (suppository). This information is not intended to replace advice given to you by your health care provider. Make sure you discuss any questions you have with your health care provider. Document Released: 01/21/2005 Document Revised: 05/27/2016 Document Reviewed: 10/07/2015 Elsevier Interactive Patient Education  2018 [employer].  

## 2018-01-23 LAB — GC/CHLAMYDIA PROBE AMP
Chlamydia trachomatis, NAA: NEGATIVE
NEISSERIA GONORRHOEAE BY PCR: NEGATIVE

## 2018-02-08 ENCOUNTER — Other Ambulatory Visit: Payer: Self-pay | Admitting: Family Medicine

## 2018-02-09 NOTE — Telephone Encounter (Signed)
May have this sent refills will need follow-up office visit before further

## 2018-02-24 ENCOUNTER — Telehealth: Payer: Self-pay | Admitting: Family Medicine

## 2018-02-24 NOTE — Telephone Encounter (Signed)
error 

## 2018-02-27 ENCOUNTER — Telehealth: Payer: Self-pay | Admitting: Family Medicine

## 2018-02-27 ENCOUNTER — Ambulatory Visit: Payer: BLUE CROSS/BLUE SHIELD | Admitting: Family Medicine

## 2018-02-27 ENCOUNTER — Other Ambulatory Visit: Payer: Self-pay | Admitting: Family Medicine

## 2018-02-27 MED ORDER — BIOTIN 1000 MCG PO TABS: 1000.0000 ug | ORAL_TABLET | Freq: Every day | ORAL | 0 refills | Status: DC

## 2018-02-27 MED ORDER — SERTRALINE HCL 100 MG PO TABS: ORAL_TABLET | ORAL | 0 refills | Status: DC

## 2018-02-27 MED ORDER — INDAPAMIDE 1.25 MG PO TABS: 1.2500 mg | ORAL_TABLET | Freq: Every morning | ORAL | 0 refills | Status: DC

## 2018-02-27 MED ORDER — FLUTICASONE PROPIONATE 50 MCG/ACT NA SUSP: NASAL | 0 refills | Status: DC

## 2018-02-27 MED ORDER — DICLOFENAC SODIUM 75 MG PO TBEC: 75.0000 mg | DELAYED_RELEASE_TABLET | Freq: Two times a day (BID) | ORAL | 0 refills | Status: DC

## 2018-02-27 MED ORDER — LORAZEPAM 1 MG PO TABS: ORAL_TABLET | ORAL | 0 refills | Status: DC

## 2018-02-27 MED ORDER — LOSARTAN POTASSIUM 50 MG PO TABS: 50.0000 mg | ORAL_TABLET | Freq: Every day | ORAL | 0 refills | Status: DC

## 2018-02-27 NOTE — Telephone Encounter (Signed)
Patient notified

## 2018-02-27 NOTE — Telephone Encounter (Signed)
sertraline (ZOLOFT) 100 MG tablet  diclofenac (VOLTAREN) 75 MG EC tablet  losartan (COZAAR) 50 MG tablet  fluticasone (FLONASE) 50 MCG/ACT nasal spray  LORazepam (ATIVAN) 1 MG tablet indapamide (LOZOL) 1.25 MG tablet Biotin 1000 MCG tablet   Pt is needing refills on medications. She had a med check for this afternoon with Ria Comment. It has been rescheduled to next Friday 03/06/18.   Please send to [facility], [place]

## 2018-02-27 NOTE — Telephone Encounter (Signed)
Please go ahead with 1 refill on each 1 of these

## 2018-02-27 NOTE — Telephone Encounter (Signed)
Prescriptions sent to pharmacy.  Left message to return call to notify patient.

## 2018-03-06 ENCOUNTER — Ambulatory Visit: Payer: BLUE CROSS/BLUE SHIELD | Admitting: Family Medicine

## 2018-03-06 ENCOUNTER — Encounter: Payer: Self-pay | Admitting: Family Medicine

## 2018-03-06 VITALS — BP 116/76 | Wt 227.6 lb

## 2018-03-06 DIAGNOSIS — F419 Anxiety disorder, unspecified: Secondary | ICD-10-CM

## 2018-03-06 DIAGNOSIS — Z13220 Encounter for screening for lipoid disorders: Secondary | ICD-10-CM | POA: Diagnosis not present

## 2018-03-06 DIAGNOSIS — M79621 Pain in right upper arm: Secondary | ICD-10-CM

## 2018-03-06 DIAGNOSIS — Z79899 Other long term (current) drug therapy: Secondary | ICD-10-CM | POA: Diagnosis not present

## 2018-03-06 DIAGNOSIS — R519 Headache, unspecified: Secondary | ICD-10-CM

## 2018-03-06 DIAGNOSIS — R51 Headache: Secondary | ICD-10-CM

## 2018-03-06 DIAGNOSIS — F329 Major depressive disorder, single episode, unspecified: Secondary | ICD-10-CM

## 2018-03-06 DIAGNOSIS — I10 Essential (primary) hypertension: Secondary | ICD-10-CM | POA: Diagnosis not present

## 2018-03-06 DIAGNOSIS — F32A Depression, unspecified: Secondary | ICD-10-CM

## 2018-03-06 DIAGNOSIS — E559 Vitamin D deficiency, unspecified: Secondary | ICD-10-CM

## 2018-03-06 MED ORDER — FLUTICASONE PROPIONATE 50 MCG/ACT NA SUSP: NASAL | 11 refills | Status: DC

## 2018-03-06 MED ORDER — SERTRALINE HCL 100 MG PO TABS: ORAL_TABLET | ORAL | 5 refills | Status: DC

## 2018-03-06 MED ORDER — INDAPAMIDE 1.25 MG PO TABS: 1.2500 mg | ORAL_TABLET | Freq: Every morning | ORAL | 5 refills | Status: DC

## 2018-03-06 MED ORDER — LOSARTAN POTASSIUM 50 MG PO TABS: 50.0000 mg | ORAL_TABLET | Freq: Every day | ORAL | 5 refills | Status: DC

## 2018-03-06 NOTE — Progress Notes (Signed)
Subjective:    Patient ID: Valerie Elliott, female    DOB: Oct 04, 1965, 53 y.o.   MRN: [id number]  HPI Pt here today for medication check. Pt had 30 day supply of Biotin, Voltaren, Flonase, Lozol, Ativan, Cozaar and Zoloft sent in on 02/27/2018.   Pt states for last couple of days she has been having headaches. More like pressure not migraine type headache. Bilateral temple area - constant pressure. No URI symptoms. Has not been taking flonase for a couple days. Denies vision changes, no N/V. No nighttime awakenings.   HTN: compliant with meds, no adverse effects. Does not do home BP readings.   Right biceps pain: states NSAIDs were not helpful. Trying to do stretches. States pain is constant, worse at night. Does not recall any injury or repetitive movements. No pain in shoulder.   Anxiety/Depression: Takes zoloft daily, compliant. Feels like mood is stable. Ativan uses once per week - typically only takes at night to help with sleep.   Review of Systems  Constitutional: Negative for chills and fever.  HENT: Negative for congestion, ear pain and sore throat.   Eyes: Negative for photophobia and visual disturbance.  Respiratory: Negative for cough and shortness of breath.   Cardiovascular: Negative for chest pain, palpitations and leg swelling.  Gastrointestinal: Negative for nausea and vomiting.  Musculoskeletal: Positive for myalgias (right upper arm). Negative for arthralgias and joint swelling.  Neurological: Positive for headaches. Negative for dizziness, weakness and numbness.  Psychiatric/Behavioral: Negative for dysphoric mood, sleep disturbance and suicidal ideas. The patient is not nervous/anxious.        Objective:   Physical Exam Vitals signs and nursing note reviewed.  Constitutional:      General: She is not in acute distress.    Appearance: She is well-developed.  HENT:     Head: Normocephalic and atraumatic.     Right Ear: Tympanic membrane normal.     Left Ear:  Tympanic membrane normal.     Nose: Nose normal.     Mouth/Throat:     Mouth: Mucous membranes are moist.     Pharynx: Oropharynx is clear.  Eyes:     General:        Right eye: No discharge.        Left eye: No discharge.     Extraocular Movements: Extraocular movements intact.     Pupils: Pupils are equal, round, and reactive to light.  Neck:     Musculoskeletal: Neck supple. No neck rigidity.  Cardiovascular:     Rate and Rhythm: Normal rate and regular rhythm.     Heart sounds: Normal heart sounds. No murmur.  Pulmonary:     Effort: Pulmonary effort is normal. No respiratory distress.     Breath sounds: Normal breath sounds.  Musculoskeletal:     Comments: Right shoulder with normal ROM, no pain in the shoulder area. Tender to palpation over right upper outer arm. No pain directly over biceps tendon insertion site. Pain to right mid upper arm with shoulder extension and internal rotation. Strength intact 5/5.   Lymphadenopathy:     Cervical: No cervical adenopathy.  Skin:    General: Skin is warm and dry.  Neurological:     Mental Status: She is alert and oriented to person, place, and time.    Depression screen [facility] 2/9 03/06/2018 09/27/2016  Decreased Interest 1 1  Down, Depressed, Hopeless 1 3  PHQ - 2 Score 2 4  Altered sleeping 2 3  Tired,  decreased energy 2 1  Change in appetite 1 1  Feeling bad or failure about yourself  2 0  Trouble concentrating 1 1  Moving slowly or fidgety/restless 2 3  Suicidal thoughts 0 0  PHQ-9 Score 12 13  Difficult doing work/chores Not difficult at all Somewhat difficult    GAD 7 : Generalized Anxiety Score 03/06/2018  Nervous, Anxious, on Edge 1  Control/stop worrying 1  Worry too much - different things 1  Trouble relaxing 2  Restless 1  Easily annoyed or irritable 1  Afraid - awful might happen 1  Total GAD 7 Score 8  Anxiety Difficulty Not difficult at all          Assessment & Plan:  1. Essential hypertension - Plan:  Basic Metabolic Panel (BMET) BP is currently under good control with medications, no adverse effects. Will continue. Lab work ordered, will notify of results. Continue healthy diet and exercise. Recommend f/u in 6 months.   2. Anxiety and depression Reviewed phq 9 and gad 7 with patient. Doing well on current medications, rare use of ativan. Pt would like to continue at this time. Will refill zoloft. She is not suicidal, she understands if she develop SI/HI she should seek emergency care. F/u in 6 months.  3. Lipid screening - Plan: Hepatic function panel, Lipid Profile  4. High risk medication use - Plan: Basic Metabolic Panel (BMET), Hepatic function panel, Lipid Profile  5. Vitamin D deficiency - Plan: Vitamin D (25 hydroxy) PT with hx of vitamin D deficiency, low normal on last check, pt would like this rechecked. Will notify of results.   6. Pain in right upper arm - Plan: Ambulatory referral to Orthopedic Surgery, DG Humerus Right Given failure of conservative treatment measures, recommend we obtain an x-ray to rule out an bony abnormality. Also recommend referral to orthopedics for further evaluation and treatment.   7. Headaches No red flags noted in history or on exam. BP under good control. Recommended humidifier in the house, saline nasal sprays. Reminded she has flexeril on med list from Dr. Delice Lesch that she can take prn for headaches; recommend she try this, cautioned drowsiness. Headache diary given to patient to keep track of her symptoms. If h/a do not improve or persist over the next 2-3 weeks she should schedule a f/u to evaluate these further. Pt verbalized understanding.  Dr. Sallee Lange was consulted on this case and is in agreement with the above treatment plan.

## 2018-03-11 ENCOUNTER — Encounter: Payer: Self-pay | Admitting: Family Medicine

## 2018-03-15 ENCOUNTER — Other Ambulatory Visit: Payer: Self-pay | Admitting: Family Medicine

## 2018-03-17 ENCOUNTER — Telehealth: Payer: Self-pay | Admitting: Family Medicine

## 2018-03-17 ENCOUNTER — Other Ambulatory Visit: Payer: Self-pay

## 2018-03-17 MED ORDER — DICLOFENAC SODIUM 75 MG PO TBEC: 75.0000 mg | DELAYED_RELEASE_TABLET | Freq: Two times a day (BID) | ORAL | 2 refills | Status: DC

## 2018-03-17 NOTE — Telephone Encounter (Signed)
Pharmacy requesting refill on Diclofenac 75 mg tablet. Take one tablet by mouth twice daily. Please advise. Thank you

## 2018-03-17 NOTE — Telephone Encounter (Signed)
Patient may have this +2 refills Please make sure that the patient is aware that anti-inflammatories along with antidepressant such as Zoloft can increase the risk of gastric ulcers and/or bleeding It is important for the patient to be aware of this potential Also be aware that if she started to have epigastric pain or black or tarry stools that can be a sign of GI bleed

## 2018-03-17 NOTE — Telephone Encounter (Signed)
Contacted patient to make her aware that anti inflammatories along with anti depressant can increase the risk of gastric ulcers and/or bleeding. If patient starts having epigastric pain, black or tarry stools that can be a sign of GI bleed. Refills sent in. Pt verbalized understanding.

## 2018-03-20 ENCOUNTER — Ambulatory Visit: Payer: BLUE CROSS/BLUE SHIELD | Admitting: Family Medicine

## 2018-04-01 ENCOUNTER — Ambulatory Visit: Payer: BLUE CROSS/BLUE SHIELD | Admitting: Neurology

## 2018-04-01 NOTE — Telephone Encounter (Signed)
This is already been filled thank you

## 2018-04-30 ENCOUNTER — Other Ambulatory Visit: Payer: Self-pay | Admitting: Family Medicine

## 2018-04-30 MED ORDER — LORAZEPAM 1 MG PO TABS: ORAL_TABLET | ORAL | 1 refills | Status: DC

## 2018-04-30 NOTE — Telephone Encounter (Signed)
May have this +1 refill 

## 2018-09-28 ENCOUNTER — Telehealth: Payer: Self-pay | Admitting: Family Medicine

## 2018-09-28 NOTE — Telephone Encounter (Signed)
Patent is requesting refill on blood pressure medication. She is at work at the time and couldn't remember name but has appointment on 8/31 for medication checkup. [facility]

## 2018-09-29 ENCOUNTER — Other Ambulatory Visit: Payer: Self-pay | Admitting: *Deleted

## 2018-09-29 MED ORDER — SERTRALINE HCL 100 MG PO TABS: ORAL_TABLET | ORAL | 0 refills | Status: DC

## 2018-10-02 ENCOUNTER — Other Ambulatory Visit: Payer: Self-pay | Admitting: Family Medicine

## 2018-10-02 ENCOUNTER — Telehealth: Payer: Self-pay | Admitting: Family Medicine

## 2018-10-02 MED ORDER — LOSARTAN POTASSIUM 50 MG PO TABS: 50.0000 mg | ORAL_TABLET | Freq: Every day | ORAL | 1 refills | Status: DC

## 2018-10-02 NOTE — Telephone Encounter (Signed)
This and 1 refill given Please schedule OV here or virtual in Sept

## 2018-10-02 NOTE — Telephone Encounter (Signed)
Pharmacy requesting refill on Losartan 50 mg tablet. Take one tablet by mouth once daily. Pt last seen 03/06/2018 for HTN. Please advise. Thank you

## 2018-10-03 ENCOUNTER — Other Ambulatory Visit: Payer: Self-pay

## 2018-10-05 ENCOUNTER — Other Ambulatory Visit: Payer: Self-pay

## 2018-10-05 ENCOUNTER — Ambulatory Visit (INDEPENDENT_AMBULATORY_CARE_PROVIDER_SITE_OTHER): Payer: BC Managed Care – PPO | Admitting: Family Medicine

## 2018-10-05 DIAGNOSIS — E559 Vitamin D deficiency, unspecified: Secondary | ICD-10-CM | POA: Diagnosis not present

## 2018-10-05 DIAGNOSIS — I10 Essential (primary) hypertension: Secondary | ICD-10-CM

## 2018-10-05 DIAGNOSIS — E785 Hyperlipidemia, unspecified: Secondary | ICD-10-CM | POA: Diagnosis not present

## 2018-10-05 NOTE — Telephone Encounter (Signed)
Patient has medication follow up visit today

## 2018-10-05 NOTE — Progress Notes (Signed)
   Subjective:    Patient ID: Valerie Elliott, female    DOB: 1965-10-26, 53 y.o.   MRN: [id number]  Hypertension This is a chronic problem. The current episode started more than 1 year ago. Pertinent negatives include no chest pain or shortness of breath. Risk factors for coronary artery disease include post-menopausal state (losartan and lozol). Treatments tried: losartan and lozol. There are no compliance problems.   Patient has has underlying anxiety depression symptoms doing well with Zoloft on a regular basis does use Ativan as needed but not often She takes her blood pressure medicines on a regular basis and does try to watch her diet she is trying to keep her weight down she will check blood pressure readings in the future and send [place] some readings Virtual Visit via Video Note  I connected with Valerie Elliott on 10/05/18 at  3:00 PM EDT by a video enabled telemedicine application and verified that I am speaking with the correct person using two identifiers.  Location: Patient: home Provider: office   I discussed the limitations of evaluation and management by telemedicine and the availability of in person appointments. The patient expressed understanding and agreed to proceed.  History of Present Illness:    Observations/Objective:   Assessment and Plan:   Follow Up Instructions:    I discussed the assessment and treatment plan with the patient. The patient was provided an opportunity to ask questions and all were answered. The patient agreed with the plan and demonstrated an understanding of the instructions.   The patient was advised to call back or seek an in-person evaluation if the symptoms worsen or if the condition fails to improve as anticipated.  I provided 15 minutes of non-face-to-face time during this encounter.        Review of Systems  Constitutional: Negative for activity change, appetite change and fatigue.  HENT: Negative for congestion and  rhinorrhea.   Respiratory: Negative for cough and shortness of breath.   Cardiovascular: Negative for chest pain and leg swelling.  Gastrointestinal: Negative for abdominal pain and diarrhea.  Endocrine: Negative for polydipsia and polyphagia.  Skin: Negative for color change.  Neurological: Negative for dizziness and weakness.  Psychiatric/Behavioral: Negative for behavioral problems and confusion.       Objective:   Physical Exam   Patient had virtual visit Appears to be in no distress Atraumatic Neuro able to relate and oriented No apparent resp distress Color normal       Assessment & Plan:  HTN- Patient was seen today as part of a visit regarding hypertension. The importance of healthy diet and regular physical activity was discussed. The importance of compliance with medications discussed.  Ideal goal is to keep blood pressure low elevated levels certainly below [id number] when possible.  The patient was counseled that keeping blood pressure under control lessen his risk of complications.  The importance of regular follow-ups was discussed with the patient.  Low-salt diet such as DASH recommended.  Regular physical activity was recommended as well.  Patient was advised to keep regular follow-ups. As best patient knows blood pressure under good control continue medications  Depression under good control continue current medications  Lab work to be done this fall follow-up in 6 months

## 2018-10-05 NOTE — Telephone Encounter (Signed)
Patient has medication follow up appointment today.

## 2018-11-04 ENCOUNTER — Other Ambulatory Visit: Payer: Self-pay | Admitting: Family Medicine

## 2018-11-04 NOTE — Telephone Encounter (Signed)
May have 4 months worth 

## 2018-11-09 ENCOUNTER — Other Ambulatory Visit: Payer: Self-pay | Admitting: Family Medicine

## 2018-11-27 ENCOUNTER — Other Ambulatory Visit: Payer: Self-pay | Admitting: Family Medicine

## 2018-12-21 ENCOUNTER — Other Ambulatory Visit: Payer: Self-pay | Admitting: Neurology

## 2018-12-21 ENCOUNTER — Other Ambulatory Visit: Payer: Self-pay | Admitting: Family Medicine

## 2019-01-03 ENCOUNTER — Other Ambulatory Visit: Payer: Self-pay | Admitting: Family Medicine

## 2019-01-24 ENCOUNTER — Other Ambulatory Visit: Payer: Self-pay | Admitting: Family Medicine

## 2019-01-25 NOTE — Telephone Encounter (Signed)
May have each with 2 refills follow-up by March

## 2019-02-15 ENCOUNTER — Encounter: Payer: Self-pay | Admitting: Family Medicine

## 2019-03-14 ENCOUNTER — Other Ambulatory Visit: Payer: Self-pay | Admitting: Neurology

## 2019-03-14 ENCOUNTER — Other Ambulatory Visit: Payer: Self-pay | Admitting: Family Medicine

## 2019-03-15 NOTE — Telephone Encounter (Signed)
May have 1 refill please have from a mail a letter to the patient telling her she needs to do a follow-up office visit or virtual visit

## 2019-04-14 ENCOUNTER — Other Ambulatory Visit: Payer: Self-pay | Admitting: Family Medicine

## 2019-04-14 DIAGNOSIS — I10 Essential (primary) hypertension: Secondary | ICD-10-CM

## 2019-04-14 DIAGNOSIS — E559 Vitamin D deficiency, unspecified: Secondary | ICD-10-CM

## 2019-04-14 DIAGNOSIS — Z79899 Other long term (current) drug therapy: Secondary | ICD-10-CM

## 2019-04-14 DIAGNOSIS — E785 Hyperlipidemia, unspecified: Secondary | ICD-10-CM

## 2019-04-14 NOTE — Telephone Encounter (Signed)
Scheduled 4/9. Pt wants to know if she needs lab work

## 2019-04-14 NOTE — Telephone Encounter (Signed)
1 refill each needs follow-up office visit either virtual or in person

## 2019-04-14 NOTE — Telephone Encounter (Signed)
Last labs 04/05/17 lipid, liver, bmp, vit d

## 2019-04-14 NOTE — Telephone Encounter (Signed)
Please schedule and then route back to nurses 

## 2019-04-14 NOTE — Telephone Encounter (Signed)
Last med check up 10/05/18

## 2019-04-15 NOTE — Telephone Encounter (Signed)
May have 90-day on refill May do lipid, liver, metabolic 7, vitamin D

## 2019-04-30 DIAGNOSIS — Z79899 Other long term (current) drug therapy: Secondary | ICD-10-CM | POA: Diagnosis not present

## 2019-04-30 DIAGNOSIS — E559 Vitamin D deficiency, unspecified: Secondary | ICD-10-CM | POA: Diagnosis not present

## 2019-04-30 DIAGNOSIS — E785 Hyperlipidemia, unspecified: Secondary | ICD-10-CM | POA: Diagnosis not present

## 2019-04-30 DIAGNOSIS — I10 Essential (primary) hypertension: Secondary | ICD-10-CM | POA: Diagnosis not present

## 2019-05-01 LAB — BASIC METABOLIC PANEL
BUN/Creatinine Ratio: 12 (ref 9–23)
BUN: 12 mg/dL (ref 6–24)
CO2: 28 mmol/L (ref 20–29)
Calcium: 9.6 mg/dL (ref 8.7–10.2)
Chloride: 102 mmol/L (ref 96–106)
Creatinine, Ser: 1.03 mg/dL — ABNORMAL HIGH (ref 0.57–1.00)
GFR calc Af Amer: 72 mL/min/{1.73_m2} (ref >59)
GFR calc non Af Amer: 62 mL/min/{1.73_m2} (ref >59)
Glucose: 78 mg/dL (ref 65–99)
Potassium: 4.2 mmol/L (ref 3.5–5.2)
Sodium: 142 mmol/L (ref 134–144)

## 2019-05-01 LAB — LIPID PANEL
Chol/HDL Ratio: 4.9 ratio — ABNORMAL HIGH (ref 0.0–4.4)
Cholesterol, Total: 252 mg/dL — ABNORMAL HIGH (ref 100–199)
HDL: 51 mg/dL (ref >39)
LDL Chol Calc (NIH): 184 mg/dL — ABNORMAL HIGH (ref 0–99)
Triglycerides: 98 mg/dL (ref 0–149)
VLDL Cholesterol Cal: 17 mg/dL (ref 5–40)

## 2019-05-01 LAB — HEPATIC FUNCTION PANEL
ALT: 17 IU/L (ref 0–32)
AST: 20 IU/L (ref 0–40)
Albumin: 4.1 g/dL (ref 3.8–4.9)
Alkaline Phosphatase: 83 IU/L (ref 39–117)
Bilirubin Total: 0.4 mg/dL (ref 0.0–1.2)
Bilirubin, Direct: 0.12 mg/dL (ref 0.00–0.40)
Total Protein: 6.9 g/dL (ref 6.0–8.5)

## 2019-05-01 LAB — VITAMIN D 25 HYDROXY (VIT D DEFICIENCY, FRACTURES): Vit D, 25-Hydroxy: 37.8 ng/mL (ref 30.0–100.0)

## 2019-05-06 ENCOUNTER — Other Ambulatory Visit: Payer: Self-pay | Admitting: Family Medicine

## 2019-05-06 NOTE — Telephone Encounter (Signed)
Last med check up 10/05/18

## 2019-05-14 ENCOUNTER — Ambulatory Visit (INDEPENDENT_AMBULATORY_CARE_PROVIDER_SITE_OTHER): Payer: BC Managed Care – PPO | Admitting: Family Medicine

## 2019-05-14 ENCOUNTER — Other Ambulatory Visit: Payer: Self-pay

## 2019-05-14 DIAGNOSIS — R5383 Other fatigue: Secondary | ICD-10-CM

## 2019-05-14 DIAGNOSIS — E785 Hyperlipidemia, unspecified: Secondary | ICD-10-CM

## 2019-05-14 DIAGNOSIS — I10 Essential (primary) hypertension: Secondary | ICD-10-CM

## 2019-05-14 MED ORDER — LOSARTAN POTASSIUM 50 MG PO TABS: ORAL_TABLET | ORAL | 5 refills | Status: DC

## 2019-05-14 MED ORDER — INDAPAMIDE 1.25 MG PO TABS: ORAL_TABLET | ORAL | 0 refills | Status: DC

## 2019-05-14 MED ORDER — LORAZEPAM 1 MG PO TABS: ORAL_TABLET | ORAL | 5 refills | Status: DC

## 2019-05-14 MED ORDER — FLUTICASONE PROPIONATE 50 MCG/ACT NA SUSP: NASAL | 11 refills | Status: DC

## 2019-05-14 MED ORDER — SERTRALINE HCL 100 MG PO TABS: ORAL_TABLET | ORAL | 5 refills | Status: DC

## 2019-05-14 MED ORDER — LEVETIRACETAM 500 MG PO TABS: 500.0000 mg | ORAL_TABLET | Freq: Two times a day (BID) | ORAL | 1 refills | Status: DC

## 2019-05-14 NOTE — Progress Notes (Signed)
Subjective:    Patient ID: Valerie Elliott, female    DOB: [birth year], 54 y.o.   MRN: [id number]  Hypertension This is a chronic problem. (A few headaches, ankles swollen ) There are no compliance problems.   Pt states she does not check blood pressure often. Believes her headaches are due to pollen.  Pt would like to speak with provider about what she can take for allergies beside nasal spray.  We did discuss this talked about Allegra generic OTC We also discussed her lab work including vitamin D level looks good creatinine slightly up healthy eating regular fluid intake and also discuss her cholesterol being significantly up she states her diet has been subpar since being going through Covid restrictions her weight has gone up  Virtual Visit via Video Note  I connected with Valerie Elliott on 05/14/19 at  2:00 PM EDT by a video enabled telemedicine application and verified that I am speaking with the correct person using two identifiers.  Location: Patient: home Provider: office   I discussed the limitations of evaluation and management by telemedicine and the availability of in person appointments. The patient expressed understanding and agreed to proceed.  History of Present Illness:    Observations/Objective:   Assessment and Plan:   Follow Up Instructions:    I discussed the assessment and treatment plan with the patient. The patient was provided an opportunity to ask questions and all were answered. The patient agreed with the plan and demonstrated an understanding of the instructions.   The patient was advised to call back or seek an in-person evaluation if the symptoms worsen or if the condition fails to improve as anticipated.  I provided 15 minutes of non-face-to-face time during this encounter.   Results for orders placed or performed in visit on 04/14/19  Lipid panel  Result Value Ref Range   Cholesterol, Total 252 (H) 100 - 199 mg/dL   Triglycerides 98 0 -  149 mg/dL   HDL 51 >39 mg/dL   VLDL Cholesterol Cal 17 5 - 40 mg/dL   LDL Chol Calc (NIH) 184 (H) 0 - 99 mg/dL   Chol/HDL Ratio 4.9 (H) 0.0 - 4.4 ratio  Hepatic function panel  Result Value Ref Range   Total Protein 6.9 6.0 - 8.5 g/dL   Albumin 4.1 3.8 - 4.9 g/dL   Bilirubin Total 0.4 0.0 - 1.2 mg/dL   Bilirubin, Direct 0.12 0.00 - 0.40 mg/dL   Alkaline Phosphatase 83 39 - 117 IU/L   AST 20 0 - 40 IU/L   ALT 17 0 - 32 IU/L  Basic metabolic panel  Result Value Ref Range   Glucose 78 65 - 99 mg/dL   BUN 12 6 - 24 mg/dL   Creatinine, Ser 1.03 (H) 0.57 - 1.00 mg/dL   GFR calc non Af Amer 62 >59 mL/min/1.73   GFR calc Af Amer 72 >59 mL/min/1.73   BUN/Creatinine Ratio 12 9 - 23   Sodium 142 134 - 144 mmol/L   Potassium 4.2 3.5 - 5.2 mmol/L   Chloride 102 96 - 106 mmol/L   CO2 28 20 - 29 mmol/L   Calcium 9.6 8.7 - 10.2 mg/dL  VITAMIN D 25 Hydroxy (Vit-D Deficiency, Fractures)  Result Value Ref Range   Vit D, 25-Hydroxy 37.8 30.0 - 100.0 ng/mL        Review of Systems     Objective:   Physical Exam        Assessment & Plan:  1. Essential hypertension Blood pressure good control per patient continue current medicine  2. Hyperlipidemia, unspecified hyperlipidemia type Cholesterol moderately elevated patient agrees to work hard at trying to watch diet exercise bring her weight down and recheck labs in several months may need to be on medications if it does not get better  3. Other fatigue Intermittent fatigue we will check lab work Vitamin D level looks good

## 2019-05-18 NOTE — Addendum Note (Signed)
Addended by: Vicente Males on: 05/18/2019 09:12 AM   Modules accepted: Orders

## 2019-05-18 NOTE — Progress Notes (Signed)
Lab orders placed and mailed to patient with instructions to have them done in September and schedule a follow up office visit

## 2019-05-25 DIAGNOSIS — Z23 Encounter for immunization: Secondary | ICD-10-CM | POA: Diagnosis not present

## 2019-06-05 ENCOUNTER — Other Ambulatory Visit: Payer: Self-pay | Admitting: Family Medicine

## 2019-06-08 DIAGNOSIS — Z23 Encounter for immunization: Secondary | ICD-10-CM | POA: Diagnosis not present

## 2019-07-04 ENCOUNTER — Other Ambulatory Visit: Payer: Self-pay | Admitting: Family Medicine

## 2019-07-25 IMAGING — MG DIGITAL SCREENING BILATERAL MAMMOGRAM WITH CAD
4 series · 4 of 4 positions shown · non-contrast
Comparison: Previous exam(s).

CLINICAL DATA: Screening.

EXAM:
DIGITAL SCREENING BILATERAL MAMMOGRAM WITH CAD

[R MLO]
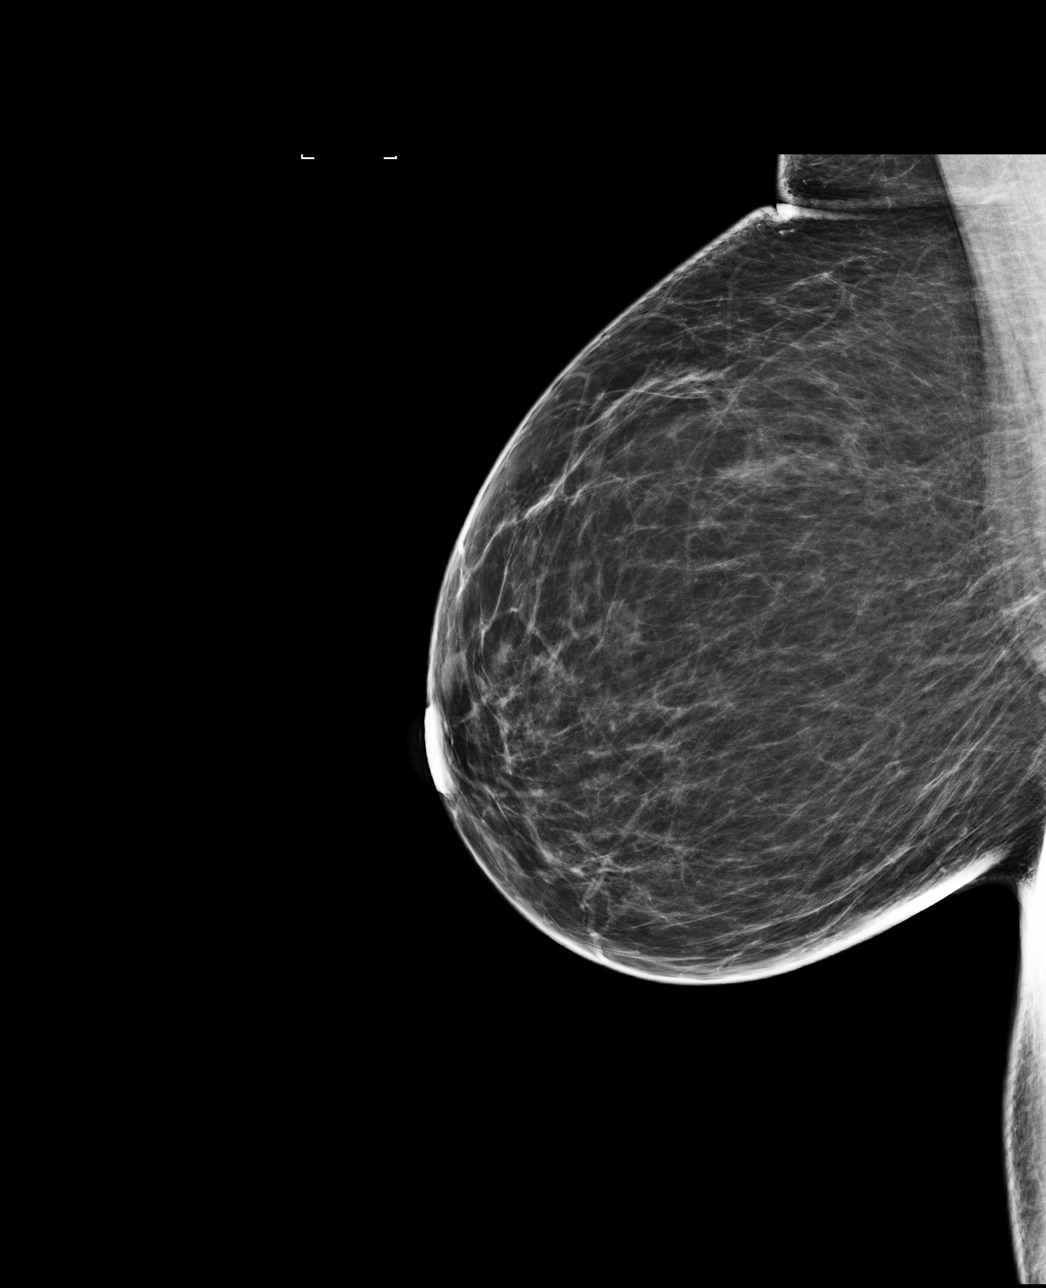

[L CC]
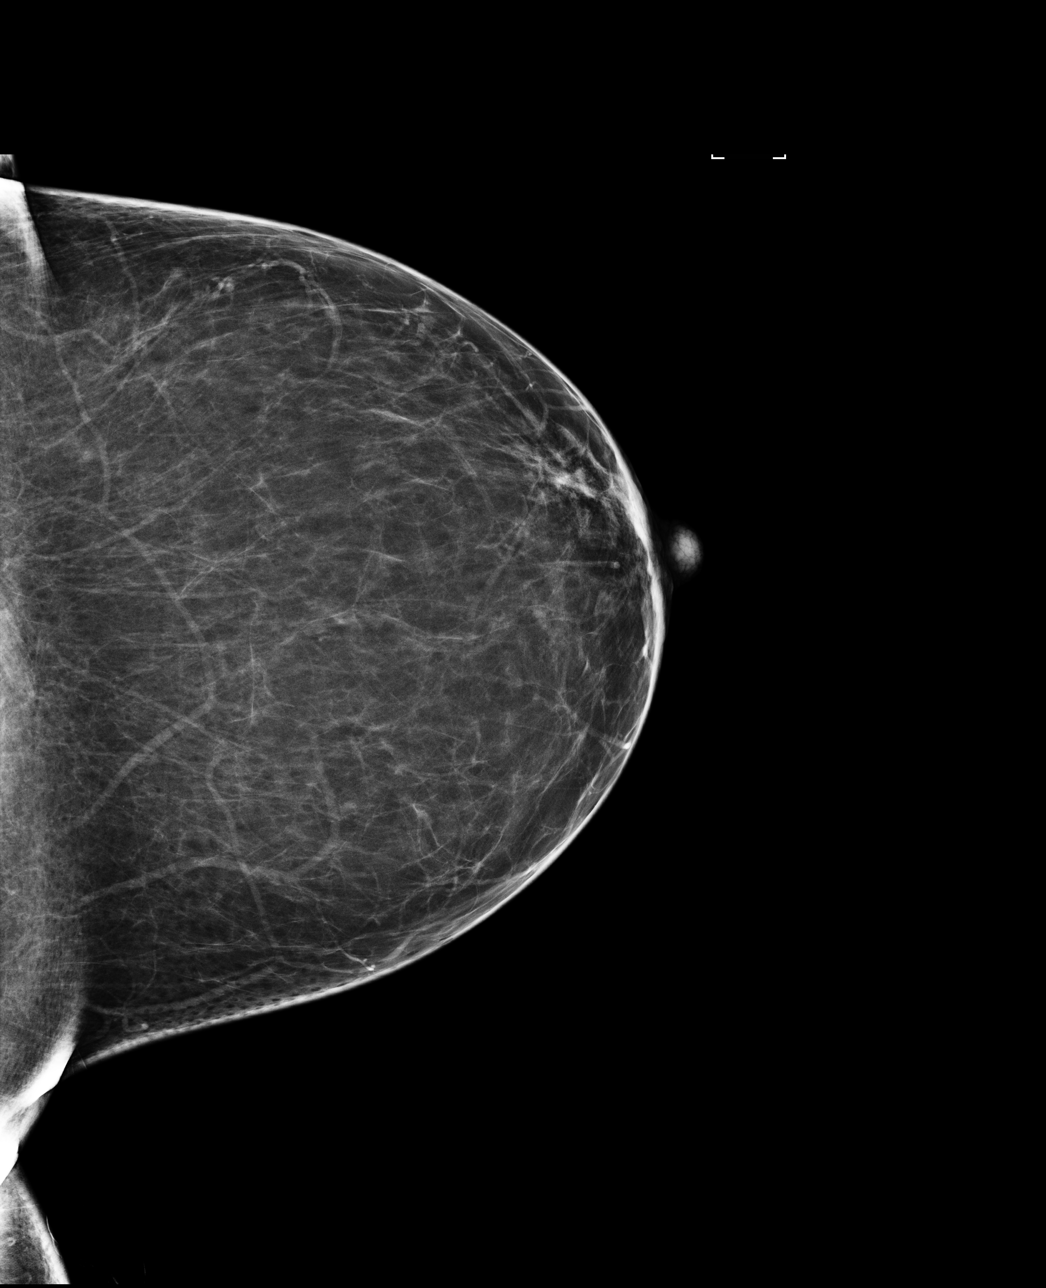

[R CC]
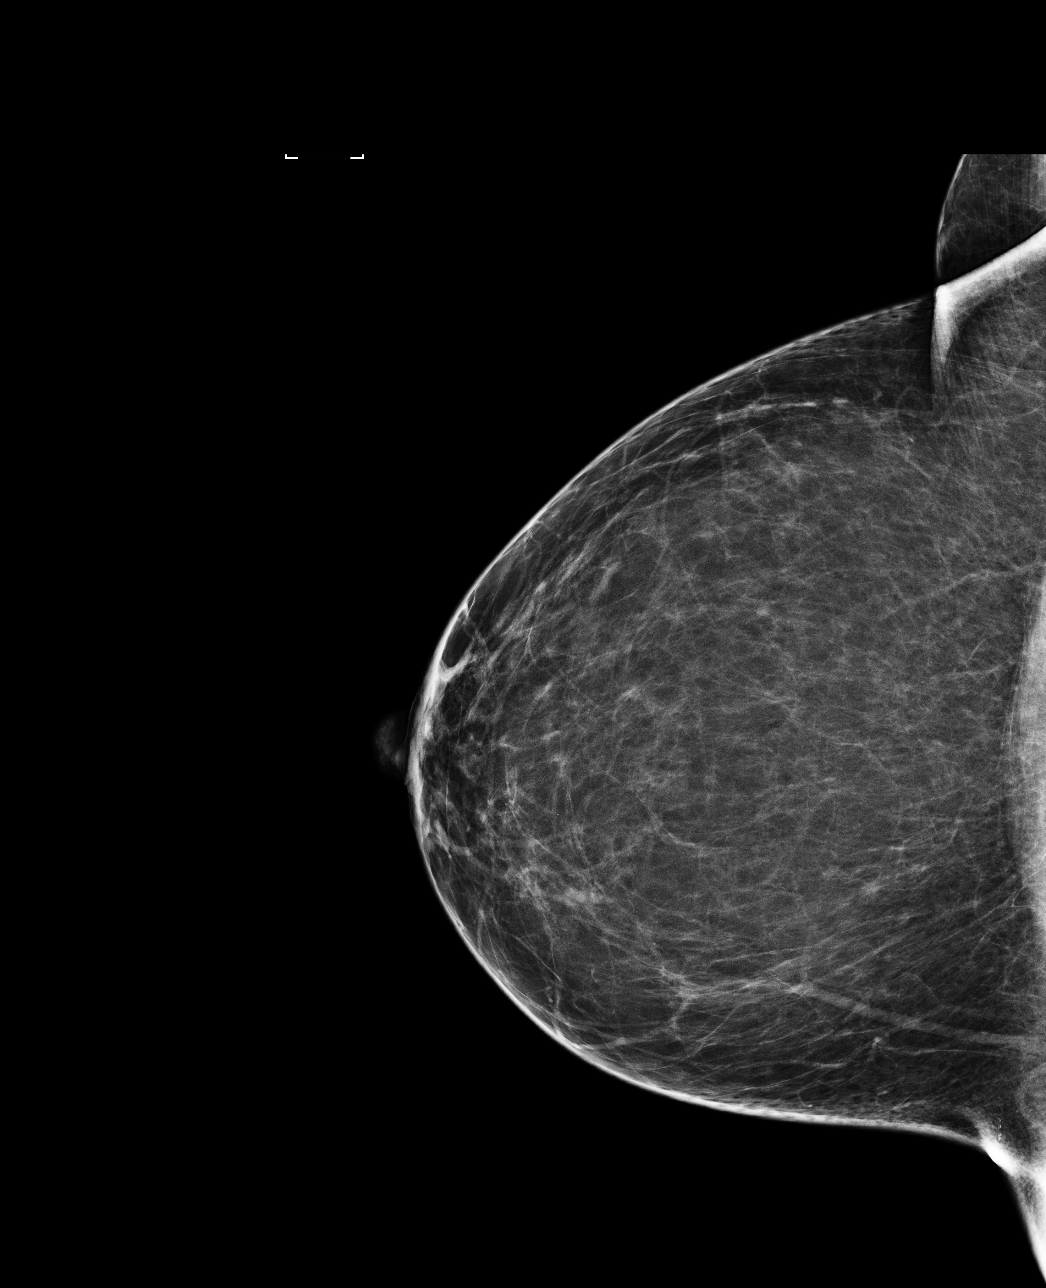

[L MLO]
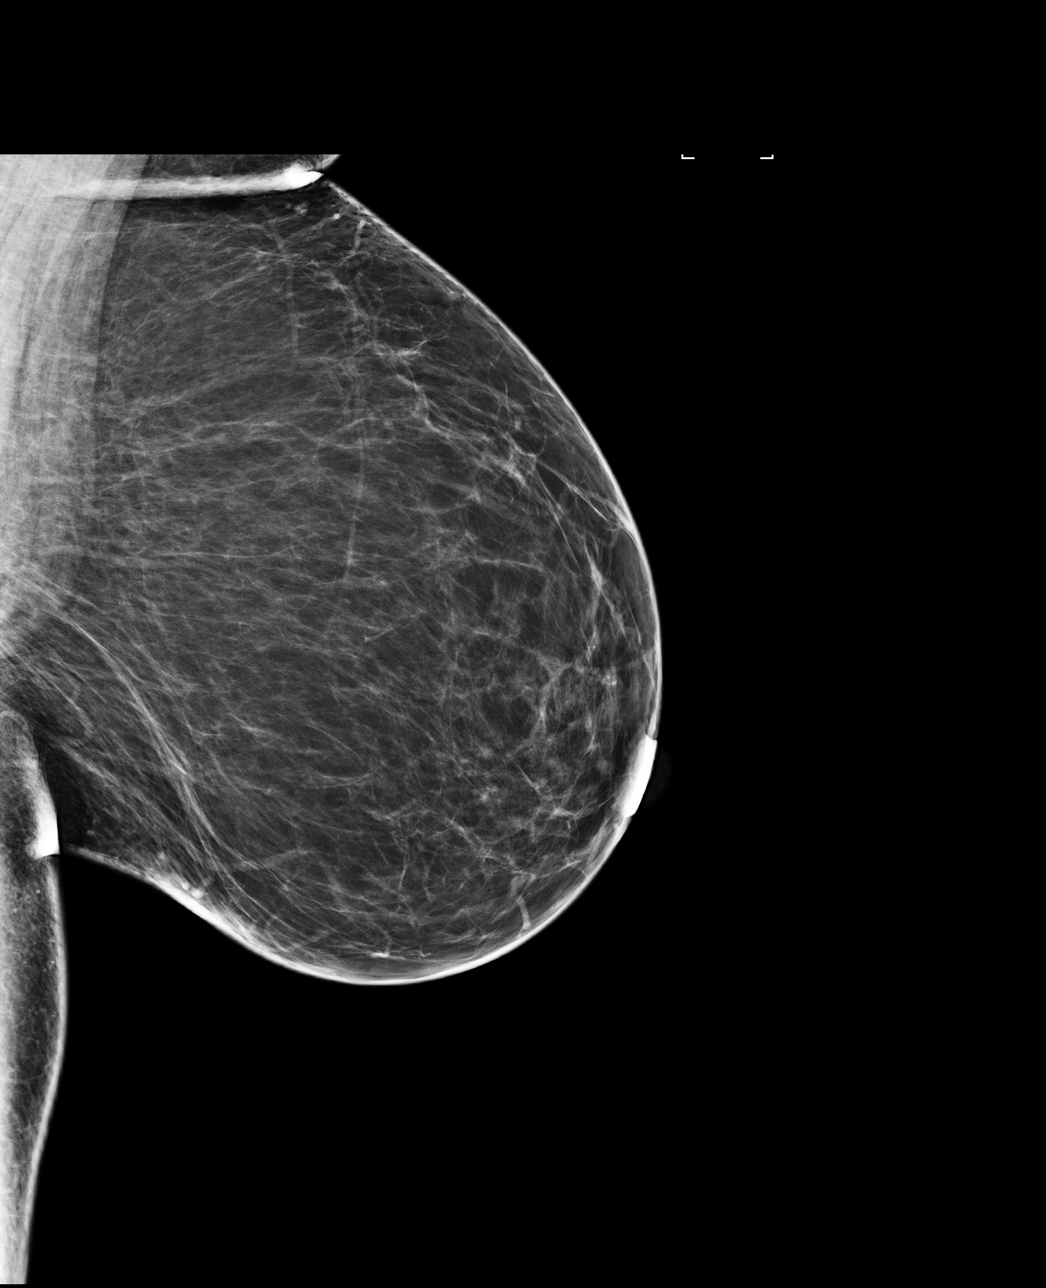

[4 of 4 positions shown; findings below may reference images not displayed]

ACR Breast Density Category b: There are scattered areas of
fibroglandular density.
FINDINGS: There are no findings suspicious for malignancy. Images were
processed with CAD.
IMPRESSION: No mammographic evidence of malignancy. A result letter of this
screening mammogram will be mailed directly to the patient.

RECOMMENDATION:
Screening mammogram in one year. (Code:AS-G-LCT)

BI-RADS CATEGORY  1: Negative.

## 2019-10-04 ENCOUNTER — Other Ambulatory Visit: Payer: Self-pay | Admitting: Family Medicine

## 2019-11-01 ENCOUNTER — Other Ambulatory Visit: Payer: Self-pay | Admitting: Family Medicine

## 2019-11-04 ENCOUNTER — Other Ambulatory Visit: Payer: Self-pay | Admitting: Family Medicine

## 2019-11-05 NOTE — Telephone Encounter (Signed)
lvm to schedule appt.  

## 2019-11-05 NOTE — Telephone Encounter (Signed)
Mychart message sent.

## 2019-11-05 NOTE — Telephone Encounter (Signed)
Please call patient to have her set up appt. Then may route back to nurses. Thank you!

## 2019-11-05 NOTE — Telephone Encounter (Signed)
Med Check appt made 10/21 at 11:00

## 2019-11-09 DIAGNOSIS — R5383 Other fatigue: Secondary | ICD-10-CM | POA: Diagnosis not present

## 2019-11-09 DIAGNOSIS — E785 Hyperlipidemia, unspecified: Secondary | ICD-10-CM | POA: Diagnosis not present

## 2019-11-09 DIAGNOSIS — I10 Essential (primary) hypertension: Secondary | ICD-10-CM | POA: Diagnosis not present

## 2019-11-10 LAB — BASIC METABOLIC PANEL
BUN/Creatinine Ratio: 12 (ref 9–23)
BUN: 12 mg/dL (ref 6–24)
CO2: 27 mmol/L (ref 20–29)
Calcium: 9.6 mg/dL (ref 8.7–10.2)
Chloride: 99 mmol/L (ref 96–106)
Creatinine, Ser: 1 mg/dL (ref 0.57–1.00)
GFR calc Af Amer: 74 mL/min/{1.73_m2} (ref >59)
GFR calc non Af Amer: 64 mL/min/{1.73_m2} (ref >59)
Glucose: 86 mg/dL (ref 65–99)
Potassium: 3.8 mmol/L (ref 3.5–5.2)
Sodium: 142 mmol/L (ref 134–144)

## 2019-11-10 LAB — LIPID PANEL
Chol/HDL Ratio: 5.6 ratio — ABNORMAL HIGH (ref 0.0–4.4)
Cholesterol, Total: 250 mg/dL — ABNORMAL HIGH (ref 100–199)
HDL: 45 mg/dL (ref >39)
LDL Chol Calc (NIH): 187 mg/dL — ABNORMAL HIGH (ref 0–99)
Triglycerides: 103 mg/dL (ref 0–149)
VLDL Cholesterol Cal: 18 mg/dL (ref 5–40)

## 2019-11-25 ENCOUNTER — Other Ambulatory Visit: Payer: Self-pay

## 2019-11-25 ENCOUNTER — Ambulatory Visit (INDEPENDENT_AMBULATORY_CARE_PROVIDER_SITE_OTHER): Payer: BC Managed Care – PPO | Admitting: Family Medicine

## 2019-11-25 ENCOUNTER — Encounter: Payer: Self-pay | Admitting: Family Medicine

## 2019-11-25 VITALS — BP 132/80 | HR 72 | Temp 97.5°F | Ht 68.75 in | Wt 231.0 lb

## 2019-11-25 DIAGNOSIS — Z23 Encounter for immunization: Secondary | ICD-10-CM

## 2019-11-25 DIAGNOSIS — E785 Hyperlipidemia, unspecified: Secondary | ICD-10-CM

## 2019-11-25 DIAGNOSIS — Z79899 Other long term (current) drug therapy: Secondary | ICD-10-CM

## 2019-11-25 MED ORDER — LORAZEPAM 1 MG PO TABS
ORAL_TABLET | ORAL | 4 refills | Status: DC
Start: 2019-11-25 — End: 2020-06-16

## 2019-11-25 MED ORDER — ROSUVASTATIN CALCIUM 10 MG PO TABS: 10.0000 mg | ORAL_TABLET | Freq: Every day | ORAL | 1 refills | Status: DC

## 2019-11-25 MED ORDER — INDAPAMIDE 1.25 MG PO TABS: ORAL_TABLET | ORAL | 1 refills | Status: DC

## 2019-11-25 MED ORDER — SERTRALINE HCL 100 MG PO TABS: ORAL_TABLET | ORAL | 1 refills | Status: DC

## 2019-11-25 MED ORDER — LOSARTAN POTASSIUM 50 MG PO TABS
50.0000 mg | ORAL_TABLET | Freq: Every day | ORAL | 1 refills | Status: DC
Start: 2019-11-25 — End: 2020-05-24

## 2019-11-25 MED ORDER — LEVETIRACETAM 500 MG PO TABS
500.0000 mg | ORAL_TABLET | Freq: Two times a day (BID) | ORAL | 1 refills | Status: DC
Start: 2019-11-25 — End: 2020-05-24

## 2019-11-25 NOTE — Progress Notes (Signed)
   Subjective:    Patient ID: Valerie Elliott, female    DOB: 01/18/1966, 54 y.o.   MRN: [id number]  Hypertension This is a chronic problem. Compliance problems: takes meds every day, walks dog and works in yard for exercise, diet could be better.    Hair has been falling out and is getting worse. Has seen dermatology.  States under a lot of stress.  Saw dermatology.  They cannot really determine why this was happening.  Could well be stress related.  Has been off of sertraline for 2 weeks due to not being able to get a refill.  We did discuss in the future for her to notify me via MyChart and we would happy to do refills to cover her till the next visit  She does take her blood pressure medicine on a regular basis.  Watches salt in her diet stays active  Recent lab work shows significantly elevated LDL which puts her at a significant risk of heart disease we talked about the importance of getting the LDL down to lessen her risk of a heart attack  Would like a flu vaccine.    Review of Systems    Denies any type of chest tightness pressure pain shortness of breath Objective:   Physical Exam  Lungs clear heart regular pulse normal extremities no edema   Blood pressure good control continue current measures    Assessment & Plan:  1. Need for vaccination Flu shot today - Flu Vaccine QUAD 6+ mos PF IM (Fluarix Quad PF)  2. Hyperlipidemia, unspecified hyperlipidemia type Importance of starting medication check lab work again in 8 to 12 weeks gradually titrate up to 10 mg every single day patient at high risk of heart disease next 10 years if she does not get this under better control - Lipid panel  3. High risk medication use Liver functions for same reason - Hepatic function panel Moods have been somewhat stressful lately ran out of her medicines refill sent in today Uses normal medication on a as needed basis but does not abuse it Blood pressure under good control with current  medications Patient working hard on her weight by eating healthy and staying physically active Follow-up in 6 months follow-up sooner problems Send [place] a MyChart message in the next several weeks if not tolerating cholesterol medicine  Moderate obesity patient working hard at healthy choices try to increase physical activity.

## 2019-11-26 ENCOUNTER — Encounter: Payer: Self-pay | Admitting: Family Medicine

## 2019-12-07 ENCOUNTER — Encounter: Payer: Self-pay | Admitting: Family Medicine

## 2019-12-08 NOTE — Telephone Encounter (Signed)
The medication could be causing the knees to hurt. Typically does not cause swelling in the legs. She may stop the medicine but if her lower legs continue to swell she could be retaining fluid and there could be other issues and therefore a office visit would be reasonable if the patient feels more comfortable just being seen we could work her into the schedule Thursday or Friday.  Other option she could stop the medicine and see if the symptoms improve and send [place] an update  Obviously if she starts having calf pain she needs to be seen ASAP thank you

## 2019-12-23 ENCOUNTER — Encounter: Payer: Self-pay | Admitting: Family Medicine

## 2019-12-27 DIAGNOSIS — Z20822 Contact with and (suspected) exposure to covid-19: Secondary | ICD-10-CM | POA: Diagnosis not present

## 2020-05-23 ENCOUNTER — Other Ambulatory Visit: Payer: Self-pay | Admitting: Family Medicine

## 2020-05-23 DIAGNOSIS — E785 Hyperlipidemia, unspecified: Secondary | ICD-10-CM

## 2020-05-23 DIAGNOSIS — I10 Essential (primary) hypertension: Secondary | ICD-10-CM

## 2020-05-23 DIAGNOSIS — G40909 Epilepsy, unspecified, not intractable, without status epilepticus: Secondary | ICD-10-CM

## 2020-05-23 DIAGNOSIS — F419 Anxiety disorder, unspecified: Secondary | ICD-10-CM

## 2020-05-23 DIAGNOSIS — F32A Depression, unspecified: Secondary | ICD-10-CM

## 2020-05-24 NOTE — Telephone Encounter (Signed)
Send my chart message to schedule appointment.

## 2020-05-24 NOTE — Telephone Encounter (Signed)
Patient made appointment for 5/13 but needing refills on medication because she will be out before her appointment.

## 2020-06-16 ENCOUNTER — Other Ambulatory Visit: Payer: Self-pay

## 2020-06-16 ENCOUNTER — Ambulatory Visit (INDEPENDENT_AMBULATORY_CARE_PROVIDER_SITE_OTHER): Payer: BC Managed Care – PPO | Admitting: Family Medicine

## 2020-06-16 VITALS — BP 116/78 | HR 60 | Temp 97.3°F | Ht 68.75 in | Wt 240.0 lb

## 2020-06-16 DIAGNOSIS — I10 Essential (primary) hypertension: Secondary | ICD-10-CM

## 2020-06-16 DIAGNOSIS — F32A Depression, unspecified: Secondary | ICD-10-CM

## 2020-06-16 DIAGNOSIS — L659 Nonscarring hair loss, unspecified: Secondary | ICD-10-CM | POA: Diagnosis not present

## 2020-06-16 DIAGNOSIS — F419 Anxiety disorder, unspecified: Secondary | ICD-10-CM

## 2020-06-16 DIAGNOSIS — E559 Vitamin D deficiency, unspecified: Secondary | ICD-10-CM

## 2020-06-16 DIAGNOSIS — G40909 Epilepsy, unspecified, not intractable, without status epilepticus: Secondary | ICD-10-CM

## 2020-06-16 DIAGNOSIS — E785 Hyperlipidemia, unspecified: Secondary | ICD-10-CM

## 2020-06-16 MED ORDER — SERTRALINE HCL 100 MG PO TABS
ORAL_TABLET | ORAL | 0 refills | Status: DC
Start: 2020-06-16 — End: 2020-11-20

## 2020-06-16 MED ORDER — LORAZEPAM 1 MG PO TABS
ORAL_TABLET | ORAL | 4 refills | Status: DC
Start: 2020-06-16 — End: 2020-07-28

## 2020-06-16 MED ORDER — ROSUVASTATIN CALCIUM 10 MG PO TABS: 10.0000 mg | ORAL_TABLET | Freq: Every day | ORAL | 1 refills | Status: DC

## 2020-06-16 MED ORDER — FLUTICASONE PROPIONATE 50 MCG/ACT NA SUSP
NASAL | 11 refills | Status: DC
Start: 2020-06-16 — End: 2021-11-09

## 2020-06-16 MED ORDER — LOSARTAN POTASSIUM 50 MG PO TABS: 1.0000 | ORAL_TABLET | Freq: Every day | ORAL | 1 refills | Status: DC

## 2020-06-16 MED ORDER — LEVETIRACETAM 500 MG PO TABS: 500.0000 mg | ORAL_TABLET | Freq: Two times a day (BID) | ORAL | 1 refills | Status: DC

## 2020-06-16 MED ORDER — INDAPAMIDE 1.25 MG PO TABS: 1.2500 mg | ORAL_TABLET | Freq: Every day | ORAL | 1 refills | Status: DC

## 2020-06-16 NOTE — Progress Notes (Signed)
Subjective:    Patient ID: Valerie Elliott, female    DOB: 05/11/1965, 55 y.o.   MRN: [id number]  HPI  Hypertension Hair loss depession   Alopecia - Plan: Lipid panel, Comprehensive metabolic panel, VITAMIN D 25 Hydroxy (Vit-D Deficiency, Fractures), TSH  Essential hypertension - Plan: indapamide (LOZOL) 1.25 MG tablet, losartan (COZAAR) 50 MG tablet, Lipid panel, Comprehensive metabolic panel, VITAMIN D 25 Hydroxy (Vit-D Deficiency, Fractures), TSH  Seizure disorder (HCC) - Plan: levETIRAcetam (KEPPRA) 500 MG tablet, Lipid panel, Comprehensive metabolic panel, VITAMIN D 25 Hydroxy (Vit-D Deficiency, Fractures), TSH  Primary hypertension - Plan: losartan (COZAAR) 50 MG tablet, Lipid panel, Comprehensive metabolic panel, VITAMIN D 25 Hydroxy (Vit-D Deficiency, Fractures), TSH  Hyperlipidemia, unspecified hyperlipidemia type - Plan: rosuvastatin (CRESTOR) 10 MG tablet, Lipid panel, Comprehensive metabolic panel, VITAMIN D 25 Hydroxy (Vit-D Deficiency, Fractures), TSH  Anxiety and depression - Plan: sertraline (ZOLOFT) 100 MG tablet  Vitamin D deficiency - Plan: Lipid panel, Comprehensive metabolic panel, VITAMIN D 25 Hydroxy (Vit-D Deficiency, Fractures), TSH  Patient is frustrated that she is having a lot of hair loss.  She saw a dermatologist in [facility] and they unfortunately were not able to help her she is having ongoing troubles and problems we will help set her up in [place]  Has blood pressure issues takes her medicine watches her diet tries to stay active  Has cholesterol issues takes her medicine tries to stay active  Mild anxiety and depression under a lot of stress a lot of loss of family members on antidepressants seemingly doing fairly well on nerve pills intermittently denies drowsiness with these  Has not had any seizures for months doing well with her medicine Review of Systems     Objective:   Physical Exam Vitals reviewed.  Constitutional:       Appearance: She is well-developed.  HENT:     Head: Normocephalic.  Cardiovascular:     Rate and Rhythm: Normal rate and regular rhythm.     Heart sounds: Normal heart sounds. No murmur heard.   Pulmonary:     Effort: Pulmonary effort is normal.     Breath sounds: Normal breath sounds.  Skin:    General: Skin is warm and dry.  Neurological:     Mental Status: She is alert.           Assessment & Plan:  1. Essential hypertension Blood pressure good control continue current measures - indapamide (LOZOL) 1.25 MG tablet; Take 1 tablet (1.25 mg total) by mouth daily.  Dispense: 90 tablet; Refill: 1 - losartan (COZAAR) 50 MG tablet; Take 1 tablet (50 mg total) by mouth daily.  Dispense: 90 tablet; Refill: 1 - Lipid panel - Comprehensive metabolic panel - VITAMIN D 25 Hydroxy (Vit-D Deficiency, Fractures) - TSH  2. Seizure disorder (HCC) No seizures continue medication May drive - levETIRAcetam (KEPPRA) 500 MG tablet; Take 1 tablet (500 mg total) by mouth 2 (two) times daily.  Dispense: 180 tablet; Refill: 1 - Lipid panel - Comprehensive metabolic panel - VITAMIN D 25 Hydroxy (Vit-D Deficiency, Fractures) - TSH  3. Primary hypertension Once again blood pressure good control minimize salt stay active try to continue to stay active and try to bring weight down - losartan (COZAAR) 50 MG tablet; Take 1 tablet (50 mg total) by mouth daily.  Dispense: 90 tablet; Refill: 1 - Lipid panel - Comprehensive metabolic panel - VITAMIN D 25 Hydroxy (Vit-D Deficiency, Fractures) - TSH  4. Hyperlipidemia, unspecified hyperlipidemia  type Continue cholesterol medicine.  Check lipid panel.  Watch diet closely. - rosuvastatin (CRESTOR) 10 MG tablet; Take 1 tablet (10 mg total) by mouth daily.  Dispense: 90 tablet; Refill: 1 - Lipid panel - Comprehensive metabolic panel - VITAMIN D 25 Hydroxy (Vit-D Deficiency, Fractures) - TSH  5. Anxiety and depression Take Zoloft on a regular basis.   Ativan only intermittently caution drowsiness not for frequent use - sertraline (ZOLOFT) 100 MG tablet; TAKE 1 & 1/2 (ONE & ONE-HALF) TABLETS BY MOUTH ONCE DAILY  Dispense: 135 tablet; Refill: 0  6. Alopecia Referral to dermatology for further evaluation of this [facility] dermatology preferred - Lipid panel - Comprehensive metabolic panel - VITAMIN D 25 Hydroxy (Vit-D Deficiency, Fractures) - TSH  7. Vitamin D deficiency Check vitamin D level previous vitamin D deficiency - Lipid panel - Comprehensive metabolic panel - VITAMIN D 25 Hydroxy (Vit-D Deficiency, Fractures) - TSH Follow-up 6 months

## 2020-06-17 LAB — LIPID PANEL
Chol/HDL Ratio: 2.8 ratio (ref 0.0–4.4)
Cholesterol, Total: 143 mg/dL (ref 100–199)
HDL: 52 mg/dL (ref >39)
LDL Chol Calc (NIH): 75 mg/dL (ref 0–99)
Triglycerides: 84 mg/dL (ref 0–149)
VLDL Cholesterol Cal: 16 mg/dL (ref 5–40)

## 2020-06-17 LAB — COMPREHENSIVE METABOLIC PANEL
ALT: 17 IU/L (ref 0–32)
AST: 20 IU/L (ref 0–40)
Albumin/Globulin Ratio: 1.5 (ref 1.2–2.2)
Albumin: 4.3 g/dL (ref 3.8–4.9)
Alkaline Phosphatase: 83 IU/L (ref 44–121)
BUN/Creatinine Ratio: 11 (ref 9–23)
BUN: 12 mg/dL (ref 6–24)
Bilirubin Total: 0.8 mg/dL (ref 0.0–1.2)
CO2: 28 mmol/L (ref 20–29)
Calcium: 9.6 mg/dL (ref 8.7–10.2)
Chloride: 99 mmol/L (ref 96–106)
Creatinine, Ser: 1.08 mg/dL — ABNORMAL HIGH (ref 0.57–1.00)
Globulin, Total: 2.9 g/dL (ref 1.5–4.5)
Glucose: 90 mg/dL (ref 65–99)
Potassium: 4 mmol/L (ref 3.5–5.2)
Sodium: 141 mmol/L (ref 134–144)
Total Protein: 7.2 g/dL (ref 6.0–8.5)
eGFR: 61 mL/min/{1.73_m2} (ref >59)

## 2020-06-17 LAB — TSH: TSH: 2.3 u[IU]/mL (ref 0.450–4.500)

## 2020-06-17 LAB — VITAMIN D 25 HYDROXY (VIT D DEFICIENCY, FRACTURES): Vit D, 25-Hydroxy: 52.6 ng/mL (ref 30.0–100.0)

## 2020-06-19 NOTE — Progress Notes (Signed)
06/19/20-referral placed to dermatology

## 2020-06-19 NOTE — Addendum Note (Signed)
Addended by: Vicente Males on: 06/19/2020 01:40 PM   Modules accepted: Orders

## 2020-06-26 ENCOUNTER — Telehealth: Payer: Self-pay | Admitting: Physician Assistant

## 2020-06-26 ENCOUNTER — Encounter: Payer: Self-pay | Admitting: Family Medicine

## 2020-06-26 NOTE — Telephone Encounter (Signed)
Patient is calling for a referral appointment from Sallee Lange, MD.  Patient is scheduled for 12/19/2020 at 11:00 AM with [employer], PA-C.

## 2020-06-26 NOTE — Telephone Encounter (Signed)
Referral attached to appointment

## 2020-06-26 NOTE — Telephone Encounter (Signed)
Nurses Her appointment for the dermatologist seems to be a long ways away  I would recommend having Loma Sousa look into referral to Dr. Onalee Hua office to see if they may be able to see her for her alopecia thank you-hopefully they will be able to see her sooner

## 2020-06-28 NOTE — Telephone Encounter (Signed)
Nurses We are certainly entering into a difficult time where it can take months to get in with a specialist Please see if Loma Sousa has any other suggestions regarding dermatology offices Perhaps [place] [facility]?  I am not aware of anything that I can prescribe to counter this issue I am certainly sympathetic to what is going on with the patient would like for her to be seen sooner Please see if Loma Sousa has some suggestions regarding dermatology office options outside of [facility] and Dr. Denna Haggard

## 2020-07-28 ENCOUNTER — Other Ambulatory Visit: Payer: Self-pay | Admitting: Family Medicine

## 2020-11-18 ENCOUNTER — Other Ambulatory Visit: Payer: Self-pay | Admitting: Family Medicine

## 2020-11-18 DIAGNOSIS — I10 Essential (primary) hypertension: Secondary | ICD-10-CM

## 2020-11-18 DIAGNOSIS — F32A Anxiety disorder, unspecified: Secondary | ICD-10-CM

## 2020-11-18 DIAGNOSIS — F419 Anxiety disorder, unspecified: Secondary | ICD-10-CM

## 2020-12-19 ENCOUNTER — Ambulatory Visit: Payer: BC Managed Care – PPO | Admitting: Physician Assistant

## 2020-12-19 ENCOUNTER — Encounter: Payer: Self-pay | Admitting: Physician Assistant

## 2020-12-19 ENCOUNTER — Other Ambulatory Visit: Payer: Self-pay

## 2020-12-19 DIAGNOSIS — L659 Nonscarring hair loss, unspecified: Secondary | ICD-10-CM

## 2020-12-19 DIAGNOSIS — L281 Prurigo nodularis: Secondary | ICD-10-CM | POA: Diagnosis not present

## 2020-12-19 DIAGNOSIS — L820 Inflamed seborrheic keratosis: Secondary | ICD-10-CM

## 2020-12-19 MED ORDER — CLOBETASOL PROPIONATE 0.05 % EX SOLN: 1.0000 "application " | Freq: Two times a day (BID) | CUTANEOUS | 3 refills | Status: AC

## 2020-12-19 NOTE — Progress Notes (Signed)
   New Patient   Subjective  Valerie Elliott is a 55 y.o. female who presents for the following: Alopecia (Here for hair loss x few years. Has gotten really bad over the last few months. It does itch and patient has bumps in her scalp. Saw previous derm that did biopsy (Dr.Bevers) We can try to get report. Dr. Candida Peeling said use Rogaine.  Patient is stressed out not sure if that's causing it. Patient has had thyroid checked and is fine. Patient is low vitamin D but is not anemic. ).   The following portions of the chart were reviewed this encounter and updated as appropriate:  Tobacco  Allergies  Meds  Problems  Med Hx  Surg Hx  Fam Hx      Objective  Well appearing patient in no apparent distress; mood and affect are within normal limits.  All skin waist up examined.  Scalp Large patch of lichenified skin and broken hairs. Follicles intact.       Neck - Posterior Hyperpigmented nodules.  Mid Parietal Scalp, Right Anterior Mandible, Right Posterior Neck Erythematous plaque.s   Assessment & Plan  Nonscarring hair loss Scalp  Work on behavioral modification. Wear hats to help decrease her rubbing and picking in addition to Temovate.  clobetasol (TEMOVATE) 0.05 % external solution - Scalp Apply 1 application topically 2 (two) times daily.  Prurigo nodularis Neck - Posterior  Apply topical as needed. Warned that her skin could lighten her skin tone.   clobetasol (TEMOVATE) 0.05 % external solution - Neck - Posterior Apply 1 application topically 2 (two) times daily.  Inflamed seborrheic keratosis Mid Parietal Scalp; Right Anterior Mandible; Right Posterior Neck  Destruction of lesion - Mid Parietal Scalp, Right Anterior Mandible, Right Posterior Neck Complexity: simple   Destruction method: cryotherapy   Informed consent: discussed and consent obtained   Timeout:  patient name, date of birth, surgical site, and procedure verified Lesion destroyed using liquid  nitrogen: Yes   Cryotherapy cycles:  1 Outcome: patient tolerated procedure well with no complications   Post-procedure details: wound care instructions given      I, Fernande Treiber, PA-C, have reviewed all documentation's for this visit.  The documentation on 12/19/20 for the exam, diagnosis, procedures and orders are all accurate and complete.

## 2020-12-26 ENCOUNTER — Other Ambulatory Visit: Payer: Self-pay | Admitting: Family Medicine

## 2021-02-08 ENCOUNTER — Other Ambulatory Visit: Payer: Self-pay | Admitting: Family Medicine

## 2021-02-08 DIAGNOSIS — F32A Depression, unspecified: Secondary | ICD-10-CM

## 2021-02-08 DIAGNOSIS — R7989 Other specified abnormal findings of blood chemistry: Secondary | ICD-10-CM

## 2021-02-08 DIAGNOSIS — E559 Vitamin D deficiency, unspecified: Secondary | ICD-10-CM

## 2021-02-08 DIAGNOSIS — G40909 Epilepsy, unspecified, not intractable, without status epilepticus: Secondary | ICD-10-CM

## 2021-02-08 DIAGNOSIS — E785 Hyperlipidemia, unspecified: Secondary | ICD-10-CM

## 2021-02-08 DIAGNOSIS — I10 Essential (primary) hypertension: Secondary | ICD-10-CM

## 2021-02-08 NOTE — Telephone Encounter (Signed)
1- do labs Lipid liver met 7 vit d Hyperlip, htn, vit d def 2- schedule follow up within 60 days 3- may have 90 days on meds

## 2021-02-08 NOTE — Telephone Encounter (Signed)
Patient informed that provider wants labs done and to follow up in 60 days. Patient states she will call back and schedule appointment. Lab orders placed and prescriptions sent.

## 2021-02-21 ENCOUNTER — Other Ambulatory Visit: Payer: Self-pay

## 2021-02-21 ENCOUNTER — Encounter: Payer: Self-pay | Admitting: Family Medicine

## 2021-02-21 ENCOUNTER — Ambulatory Visit (INDEPENDENT_AMBULATORY_CARE_PROVIDER_SITE_OTHER): Payer: BC Managed Care – PPO | Admitting: Family Medicine

## 2021-02-21 DIAGNOSIS — E559 Vitamin D deficiency, unspecified: Secondary | ICD-10-CM | POA: Diagnosis not present

## 2021-02-21 DIAGNOSIS — I10 Essential (primary) hypertension: Secondary | ICD-10-CM

## 2021-02-21 DIAGNOSIS — F419 Anxiety disorder, unspecified: Secondary | ICD-10-CM

## 2021-02-21 DIAGNOSIS — E785 Hyperlipidemia, unspecified: Secondary | ICD-10-CM

## 2021-02-21 DIAGNOSIS — F32A Depression, unspecified: Secondary | ICD-10-CM

## 2021-02-21 DIAGNOSIS — G40909 Epilepsy, unspecified, not intractable, without status epilepticus: Secondary | ICD-10-CM

## 2021-02-21 MED ORDER — ROSUVASTATIN CALCIUM 10 MG PO TABS: 10.0000 mg | ORAL_TABLET | Freq: Every day | ORAL | 1 refills | Status: DC

## 2021-02-21 MED ORDER — SERTRALINE HCL 100 MG PO TABS: ORAL_TABLET | ORAL | 1 refills | Status: DC

## 2021-02-21 MED ORDER — INDAPAMIDE 1.25 MG PO TABS: 1.2500 mg | ORAL_TABLET | Freq: Every day | ORAL | 1 refills | Status: DC

## 2021-02-21 MED ORDER — LOSARTAN POTASSIUM 50 MG PO TABS: 50.0000 mg | ORAL_TABLET | Freq: Every day | ORAL | 1 refills | Status: DC

## 2021-02-21 NOTE — Progress Notes (Signed)
° °  Subjective:    Patient ID: Valerie Elliott, female    DOB: April 30, 1965, 56 y.o.   MRN: [id number]  HPI  Follow up HTN, HLD, ANX/ DEP follow up  Essential hypertension - Plan: indapamide (LOZOL) 1.25 MG tablet, losartan (COZAAR) 50 MG tablet  Primary hypertension - Plan: losartan (COZAAR) 50 MG tablet  Hyperlipidemia, unspecified hyperlipidemia type - Plan: rosuvastatin (CRESTOR) 10 MG tablet  Anxiety and depression - Plan: sertraline (ZOLOFT) 100 MG tablet  Morbid obesity due to excess calories ([place]), Chronic  Seizure disorder ([place]), Chronic She has not had any seizures taking her medications tolerating well She states she can do a better job watching her diet and getting away from sweet tea we discussed strategies Improve walking She does walk her dog Keep blood pressure under control by taking medication Watch diet Watch portions Patient denies any chest tightness pressure pain shortness of breath She states her moods are overall doing well  Review of Systems     Objective:   Physical Exam  General-in no acute distress Eyes-no discharge Lungs-respiratory rate normal, CTA CV-no murmurs,RRR Extremities skin warm dry no edema Neuro grossly normal Behavior normal, alert       Assessment & Plan:  1. Essential hypertension Blood pressure good control continue current measures watch diet stay active - indapamide (LOZOL) 1.25 MG tablet; Take 1 tablet (1.25 mg total) by mouth daily.  Dispense: 90 tablet; Refill: 1 - losartan (COZAAR) 50 MG tablet; Take 1 tablet (50 mg total) by mouth daily.  Dispense: 90 tablet; Refill: 1  2. Primary hypertension Will renew her medications It would be beneficial for the patient to work really hard at cutting back on sugars she enjoys sweet tea I encourage her to try to use's sugar substitute or unsweetened - losartan (COZAAR) 50 MG tablet; Take 1 tablet (50 mg total) by mouth daily.  Dispense: 90 tablet; Refill: 1  3. Hyperlipidemia,  unspecified hyperlipidemia type Continue cholesterol medicine.  Watch diet closely - rosuvastatin (CRESTOR) 10 MG tablet; Take 1 tablet (10 mg total) by mouth daily.  Dispense: 90 tablet; Refill: 1  4. Anxiety and depression Anxiety depression under good control with current medication stress doing well continue current medicine follow-up 6 months - sertraline (ZOLOFT) 100 MG tablet; 1 and 1/2 daily  Dispense: 135 tablet; Refill: 1  Morbid obesity healthy diet regular physical activity follow-up again in 6 months time  Seizure disorder stable refills given

## 2021-02-21 NOTE — Patient Instructions (Signed)

## 2021-02-22 ENCOUNTER — Encounter: Payer: Self-pay | Admitting: Family Medicine

## 2021-02-22 ENCOUNTER — Ambulatory Visit (INDEPENDENT_AMBULATORY_CARE_PROVIDER_SITE_OTHER): Payer: BC Managed Care – PPO | Admitting: Family Medicine

## 2021-02-22 DIAGNOSIS — H00019 Hordeolum externum unspecified eye, unspecified eyelid: Secondary | ICD-10-CM | POA: Insufficient documentation

## 2021-02-22 DIAGNOSIS — H00011 Hordeolum externum right upper eyelid: Secondary | ICD-10-CM

## 2021-02-22 LAB — HEPATIC FUNCTION PANEL
ALT: 21 IU/L (ref 0–32)
AST: 22 IU/L (ref 0–40)
Albumin: 4.4 g/dL (ref 3.8–4.9)
Alkaline Phosphatase: 88 IU/L (ref 44–121)
Bilirubin Total: 0.7 mg/dL (ref 0.0–1.2)
Bilirubin, Direct: 0.17 mg/dL (ref 0.00–0.40)
Total Protein: 7 g/dL (ref 6.0–8.5)

## 2021-02-22 LAB — LIPID PANEL
Chol/HDL Ratio: 3.1 ratio (ref 0.0–4.4)
Cholesterol, Total: 156 mg/dL (ref 100–199)
HDL: 51 mg/dL (ref >39)
LDL Chol Calc (NIH): 88 mg/dL (ref 0–99)
Triglycerides: 91 mg/dL (ref 0–149)
VLDL Cholesterol Cal: 17 mg/dL (ref 5–40)

## 2021-02-22 LAB — BASIC METABOLIC PANEL
BUN/Creatinine Ratio: 14 (ref 9–23)
BUN: 16 mg/dL (ref 6–24)
CO2: 26 mmol/L (ref 20–29)
Calcium: 9.4 mg/dL (ref 8.7–10.2)
Chloride: 99 mmol/L (ref 96–106)
Creatinine, Ser: 1.11 mg/dL — ABNORMAL HIGH (ref 0.57–1.00)
Glucose: 93 mg/dL (ref 70–99)
Potassium: 3.8 mmol/L (ref 3.5–5.2)
Sodium: 139 mmol/L (ref 134–144)
eGFR: 59 mL/min/{1.73_m2} — ABNORMAL LOW (ref >59)

## 2021-02-22 LAB — VITAMIN D 25 HYDROXY (VIT D DEFICIENCY, FRACTURES): Vit D, 25-Hydroxy: 48.3 ng/mL (ref 30.0–100.0)

## 2021-02-22 MED ORDER — MOXIFLOXACIN HCL 0.5 % OP SOLN: 1.0000 [drp] | Freq: Three times a day (TID) | OPHTHALMIC | 0 refills | Status: AC

## 2021-02-22 NOTE — Telephone Encounter (Signed)
Patient states her eye is almost swollen shut, hurts to touch eye. No drainage or redness noted. Patient given an appointment

## 2021-02-22 NOTE — Assessment & Plan Note (Signed)
Advised warm compresses.  Vigamox as directed.  Supportive care.

## 2021-02-22 NOTE — Progress Notes (Signed)
Subjective:  Patient ID: Valerie Elliott, female    DOB: 06/07/1965  Age: 56 y.o. MRN: [id number]  CC: Chief Complaint  Patient presents with   right eye swollen    Painful to touch. No discharge or redness to eye.    HPI:  56 year old female presents with the above complaint.  Patient states that yesterday she developed some discomfort of the right eye.  Subsequently developed swelling of the eyelid.  Woke up this morning and her eye was nearly swollen shut.  She states that she took Benadryl last night.  She reports mild swelling currently.  She also reports pain around the medial canthus.  No eye redness.  No eye discharge.  No vision changes.  Patient Active Problem List   Diagnosis Date Noted   Stye 02/22/2021   Anxiety and depression 04/09/2017   Special screening for malignant neoplasms, colon 04/11/2016   Family history of colon cancer 04/11/2016   Morbid obesity due to excess calories ([place]) 10/30/2015   Vitamin D deficiency 04/29/2015   Seizure disorder ([place]) 03/28/2015   Hyperlipidemia 03/27/2015   HTN (hypertension) 01/31/2014   Cervical disc disorder with radiculopathy of cervical region 06/08/2013    Social Hx   Social History   Socioeconomic History   Marital status: Single    Spouse name: Not on file   Number of children: Not on file   Years of education: Not on file   Highest education level: Not on file  Occupational History   Not on file  Tobacco Use   Smoking status: Never   Smokeless tobacco: Never  Vaping Use   Vaping Use: Never used  Substance and Sexual Activity   Alcohol use: No   Drug use: No   Sexual activity: Not on file  Other Topics Concern   Not on file  Social History Narrative   Pt lives in single story home with her son   Has 1 child   Some college education   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 (Lbs) 243.2 249 240  BMI 36.18 37.04 35.7    Physical Exam Constitutional:      General: She is not in acute distress.    Appearance: Normal appearance.  Eyes:     Comments: Normal conjunctiva bilaterally.  Right upper eyelid swelling noted.  Swelling is mild.  Tender to palpation.  Pulmonary:     Effort: Pulmonary effort is normal. No respiratory distress.  Neurological:     Mental Status: She is alert.  Psychiatric:        Mood and Affect: Mood normal.        Behavior: Behavior normal.    Lab Results  Component Value Date   WBC 10.0 01/27/2010   HGB 12.6 01/27/2010   HCT 37.0 01/27/2010  PLT 343 01/27/2010   GLUCOSE 93 02/21/2021   CHOL 156 02/21/2021   TRIG 91 02/21/2021   HDL 51 02/21/2021   LDLCALC 88 02/21/2021   ALT 21 02/21/2021   AST 22 02/21/2021   NA 139 02/21/2021   K 3.8 02/21/2021   CL 99 02/21/2021   CREATININE 1.11 (H) 02/21/2021   BUN 16 02/21/2021   CO2 26 02/21/2021   TSH 2.300 06/16/2020   INR 1.01 01/26/2010     Assessment & Plan:   Problem List Items Addressed This Visit       Other   Stye    Advised warm compresses.  Vigamox as directed.  Supportive care.       Meds ordered this encounter  Medications   moxifloxacin (VIGAMOX) 0.5 % ophthalmic solution    Sig: Place 1 drop into the right eye 3 (three) times daily for 7 days.    Dispense:  3 mL    Refill:  [place]

## 2021-02-22 NOTE — Patient Instructions (Signed)
Warm compresses.  Ibuprofen as needed.  Vigamox as prescribed.  If continues to persist, please let us know.  Take care  Dr. Lacinda Axon

## 2021-02-26 NOTE — Addendum Note (Signed)
Addended by: Dairl Ponder on: 02/26/2021 03:58 PM   Modules accepted: Orders

## 2021-04-13 ENCOUNTER — Encounter: Payer: Self-pay | Admitting: Family Medicine

## 2021-04-13 ENCOUNTER — Other Ambulatory Visit: Payer: Self-pay | Admitting: Family Medicine

## 2021-04-13 MED ORDER — POLYMYXIN B-TRIMETHOPRIM 10000-0.1 UNIT/ML-% OP SOLN: 1.0000 [drp] | Freq: Four times a day (QID) | OPHTHALMIC | 0 refills | Status: DC

## 2021-04-15 MED ORDER — LORAZEPAM 1 MG PO TABS: ORAL_TABLET | ORAL | 0 refills | Status: DC

## 2021-04-16 ENCOUNTER — Other Ambulatory Visit: Payer: Self-pay | Admitting: Family Medicine

## 2021-04-16 ENCOUNTER — Encounter: Payer: Self-pay | Admitting: Family Medicine

## 2021-04-16 MED ORDER — POLYMYXIN B-TRIMETHOPRIM 10000-0.1 UNIT/ML-% OP SOLN: 1.0000 [drp] | Freq: Four times a day (QID) | OPHTHALMIC | 0 refills | Status: AC

## 2021-04-16 NOTE — Telephone Encounter (Signed)
Prescription was resent to pharmacy. Patient notified. ?

## 2021-04-16 NOTE — Telephone Encounter (Signed)
Coral Spikes, DO   ? ?I sent it in and receipt confirmed. You can send it in again if needed.   ? ?

## 2021-04-17 ENCOUNTER — Telehealth: Payer: Self-pay | Admitting: *Deleted

## 2021-04-18 ENCOUNTER — Other Ambulatory Visit: Payer: Self-pay | Admitting: Family Medicine

## 2021-04-18 MED ORDER — MOXIFLOXACIN HCL 0.5 % OP SOLN: 1.0000 [drp] | Freq: Three times a day (TID) | OPHTHALMIC | 0 refills | Status: AC

## 2021-04-18 NOTE — Telephone Encounter (Signed)
Patient informed that another eye drop was sent in. Verbalized understanding. ?

## 2021-05-05 ENCOUNTER — Other Ambulatory Visit: Payer: Self-pay | Admitting: Family Medicine

## 2021-05-05 DIAGNOSIS — G40909 Epilepsy, unspecified, not intractable, without status epilepticus: Secondary | ICD-10-CM

## 2021-05-28 ENCOUNTER — Encounter: Payer: Self-pay | Admitting: Family Medicine

## 2021-05-29 ENCOUNTER — Encounter: Payer: Self-pay | Admitting: Family Medicine

## 2021-05-29 ENCOUNTER — Ambulatory Visit (INDEPENDENT_AMBULATORY_CARE_PROVIDER_SITE_OTHER): Payer: BC Managed Care – PPO | Admitting: Family Medicine

## 2021-05-29 VITALS — BP 122/80 | HR 58 | Temp 98.2°F | Wt 236.8 lb

## 2021-05-29 DIAGNOSIS — R438 Other disturbances of smell and taste: Secondary | ICD-10-CM | POA: Diagnosis not present

## 2021-05-29 NOTE — Progress Notes (Signed)
? ?  Subjective:  ? ? Patient ID: Valerie Elliott, female    DOB: [birth year], 56 y.o.   MRN: [id number] ? ?HPI ?Pt having metallic taste in mouth for a few days. Pt states one of her pills have changed shapes. Feels out of sorts. Smells are more dominant.  ?Intermittent metallic taste in the mouth ?Noticed that some of the smells are more dominant ?He used to be ?Denies any major setbacks otherwise ?States stress levels doing okay ?Trying to eat healthy stay active ? ? ?Review of Systems ? ?   ?Objective:  ? Physical Exam ? ?Lungs clear heart regular pulse normal BP good head and neck no masses were felt.  No sign of infection seen. ? ? ?   ?Assessment & Plan:  ?Abnormal taste-recommend other testing of labs.  If this is negative consider ENT consult ?No masses or tumors were found on exam ? ?

## 2021-05-31 LAB — VITAMIN B12: Vitamin B-12: 671 pg/mL (ref 232–1245)

## 2021-05-31 LAB — LEAD, BLOOD (ADULT >= 16 YRS): Lead-Whole Blood: <1 ug/dL (ref 0.0–3.4)

## 2021-05-31 LAB — ZINC: Zinc: 91 ug/dL (ref 44–115)

## 2021-06-24 ENCOUNTER — Encounter: Payer: Self-pay | Admitting: Family Medicine

## 2021-06-25 NOTE — Telephone Encounter (Signed)
Nurses She is at slightly higher risk of complications from [place] because of high blood pressure and also her weight.  I can do a virtual visit with her tomorrow or if one of the providers has availability today.  We could discuss Paxlovid at that time

## 2021-06-26 ENCOUNTER — Encounter: Payer: Self-pay | Admitting: Family Medicine

## 2021-06-26 ENCOUNTER — Ambulatory Visit (INDEPENDENT_AMBULATORY_CARE_PROVIDER_SITE_OTHER): Payer: BC Managed Care – PPO | Admitting: Family Medicine

## 2021-06-26 DIAGNOSIS — U071 COVID-19: Secondary | ICD-10-CM | POA: Diagnosis not present

## 2021-06-26 MED ORDER — NIRMATRELVIR/RITONAVIR (PAXLOVID) TABLET (RENAL DOSING): 2.0000 | ORAL_TABLET | Freq: Two times a day (BID) | ORAL | 0 refills | Status: DC

## 2021-06-26 MED ORDER — NIRMATRELVIR/RITONAVIR (PAXLOVID) TABLET (RENAL DOSING): 2.0000 | ORAL_TABLET | Freq: Two times a day (BID) | ORAL | 0 refills | Status: AC

## 2021-06-26 NOTE — Progress Notes (Signed)
   Subjective:    Patient ID: Valerie Elliott, female    DOB: [birth year], 56 y.o.   MRN: [id number] Virtual Visit via Video Note  I connected with Valerie Elliott on 06/26/21 at  4:10 PM EDT by a video enabled telemedicine application and verified that I am speaking with the correct person using two identifiers.  Location: Patient: Home  Provider: Office   I discussed the limitations of evaluation and management by telemedicine and the availability of in person appointments. The patient expressed understanding and agreed to proceed.  History of Present Illness:    Observations/Objective:   Assessment and Plan:   Follow Up Instructions:    I discussed the assessment and treatment plan with the patient. The patient was provided an opportunity to ask questions and all were answered. The patient agreed with the plan and demonstrated an understanding of the instructions.   The patient was advised to call back or seek an in-person evaluation if the symptoms worsen or if the condition fails to improve as anticipated.  I provided 20 minutes of non-face-to-face time during this encounter.   Sallee Lange, MD  HPI Exposure to covid from her son last week around Thursdayson was tested positive Current symptoms are Chills, diarrhea, fatigue, headache , no fevers  Has not tested at home I did talk with the patient She did a home test It was positive She relates nausea fatigue tiredness She denies shortness of breath currently denies passing out  Review of Systems     Objective:   Physical Exam  Today's visit was via telephone Physical exam was not possible for this visit  Renal dose 2 tablets twice daily for 5 days.     Assessment & Plan:   COVID infection Warning signs discussed Paxlovid sent in Stop cholesterol medicine Hold off on cholesterol medicine through Sunday Give [place] feedback if not improving over the next 3 to 5 days Call us sooner if any problems or  worse

## 2021-06-28 ENCOUNTER — Encounter: Payer: Self-pay | Admitting: Family Medicine

## 2021-06-28 NOTE — Telephone Encounter (Signed)
I would recommend staying out of work through next Wednesday.  May return to work on June 1 if feeling better.  Please give her a work note for all of this week plus Monday Tuesday Wednesday of next week.  You may share this message with [employer]  Hi [place] Definitely do the best she can with simple diet such as mashed potatoes applesauce Jell-O rice soft fruit soft vegetables.  Make sure that you are keeping yourself well-hydrated.  The diarrhea should check up by with the weekend.  Hopefully you will feel well enough to go back to work on June 1 but if you are still feeling extremely fatigued by Wednesday of next week let us know and we can extend the work note  Kula you feel better soon-connect with [place] if any problems-thanks-Dr. Nicki Reaper

## 2021-07-03 ENCOUNTER — Telehealth: Payer: Self-pay

## 2021-07-03 NOTE — Telephone Encounter (Signed)
Spoke with patient and informed per drs recommendations, she verbalized understanding.

## 2021-07-03 NOTE — Telephone Encounter (Signed)
If she is having severe trouble this evening with feeling like she is going to pass out or passes out again this evening I would recommend going to the ER  Otherwise I recommend office visit this week with me either tomorrow or Thursday morning

## 2021-07-03 NOTE — Telephone Encounter (Signed)
Patient calls states she has completed paxlovid for covid , she passed out Friday after a bowel movement, and almost passed out to today after a BM. Current symptoms are diarrhea x 3 today, coughing up mucous, chills, weakness. States her son was with her when she passed out and she doesn't believe she lost consciousness on Friday. Please advise

## 2021-07-04 ENCOUNTER — Ambulatory Visit: Payer: BC Managed Care – PPO | Admitting: Family Medicine

## 2021-07-18 ENCOUNTER — Other Ambulatory Visit: Payer: Self-pay | Admitting: Family Medicine

## 2021-07-25 ENCOUNTER — Encounter (INDEPENDENT_AMBULATORY_CARE_PROVIDER_SITE_OTHER): Payer: Self-pay | Admitting: *Deleted

## 2021-08-14 ENCOUNTER — Other Ambulatory Visit: Payer: Self-pay | Admitting: *Deleted

## 2021-08-14 ENCOUNTER — Telehealth: Payer: Self-pay

## 2021-08-14 DIAGNOSIS — Z1231 Encounter for screening mammogram for malignant neoplasm of breast: Secondary | ICD-10-CM

## 2021-08-14 NOTE — Telephone Encounter (Signed)
Patient has an appt on 08/21/21 and would like to have an order for mammogram on the same day. Would you be ok with putting the order in prior to appt. Please advise

## 2021-08-14 NOTE — Telephone Encounter (Signed)
They only do diagnostic mammos on Tuesdays at [facility]. Patient would like appt with dr Nicki Reaper moved so she can do them both on the same day.

## 2021-08-14 NOTE — Telephone Encounter (Signed)
We will go ahead with mammogram order (not sure if this will necessarily get done on that day depends on mammogram schedule but please try )thank you

## 2021-08-14 NOTE — Telephone Encounter (Addendum)
Patient scheduled for office visit with Dr Nicki Reaper 09/07/21 at 8:40am and Screening Mammo at [facility] 09/07/21 at 11am (arrived 10:45am)  Patient notified

## 2021-08-21 ENCOUNTER — Ambulatory Visit: Payer: Self-pay | Admitting: Family Medicine

## 2021-08-27 ENCOUNTER — Encounter (HOSPITAL_COMMUNITY): Payer: Self-pay | Admitting: Radiology

## 2021-08-27 ENCOUNTER — Ambulatory Visit (HOSPITAL_COMMUNITY)
Admission: RE | Admit: 2021-08-27 | Discharge: 2021-08-27 | Disposition: A | Discharge status: Home / Self Care | Payer: BC Managed Care – PPO | Source: Ambulatory Visit | Attending: Family Medicine | Admitting: Family Medicine

## 2021-08-27 DIAGNOSIS — Z1231 Encounter for screening mammogram for malignant neoplasm of breast: Secondary | ICD-10-CM | POA: Insufficient documentation

## 2021-09-07 ENCOUNTER — Ambulatory Visit (HOSPITAL_COMMUNITY): Payer: BC Managed Care – PPO

## 2021-09-07 ENCOUNTER — Ambulatory Visit: Payer: BC Managed Care – PPO | Admitting: Family Medicine

## 2021-10-15 ENCOUNTER — Ambulatory Visit (INDEPENDENT_AMBULATORY_CARE_PROVIDER_SITE_OTHER): Payer: BC Managed Care – PPO | Admitting: Family Medicine

## 2021-10-15 VITALS — BP 114/68 | Wt 233.0 lb

## 2021-10-15 DIAGNOSIS — I10 Essential (primary) hypertension: Secondary | ICD-10-CM

## 2021-10-15 DIAGNOSIS — F32A Depression, unspecified: Secondary | ICD-10-CM

## 2021-10-15 DIAGNOSIS — E785 Hyperlipidemia, unspecified: Secondary | ICD-10-CM | POA: Diagnosis not present

## 2021-10-15 DIAGNOSIS — G40909 Epilepsy, unspecified, not intractable, without status epilepticus: Secondary | ICD-10-CM

## 2021-10-15 DIAGNOSIS — Z800 Family history of malignant neoplasm of digestive organs: Secondary | ICD-10-CM

## 2021-10-15 DIAGNOSIS — F419 Anxiety disorder, unspecified: Secondary | ICD-10-CM

## 2021-10-15 DIAGNOSIS — Z23 Encounter for immunization: Secondary | ICD-10-CM | POA: Diagnosis not present

## 2021-10-15 DIAGNOSIS — Z1211 Encounter for screening for malignant neoplasm of colon: Secondary | ICD-10-CM

## 2021-10-15 MED ORDER — LEVETIRACETAM 500 MG PO TABS: 500.0000 mg | ORAL_TABLET | Freq: Two times a day (BID) | ORAL | 1 refills | Status: DC

## 2021-10-15 MED ORDER — INDAPAMIDE 1.25 MG PO TABS: 1.2500 mg | ORAL_TABLET | Freq: Every day | ORAL | 1 refills | Status: DC

## 2021-10-15 MED ORDER — LORAZEPAM 1 MG PO TABS: ORAL_TABLET | ORAL | 0 refills | Status: DC

## 2021-10-15 MED ORDER — ROSUVASTATIN CALCIUM 10 MG PO TABS: 10.0000 mg | ORAL_TABLET | Freq: Every day | ORAL | 1 refills | Status: DC

## 2021-10-15 MED ORDER — LOSARTAN POTASSIUM 50 MG PO TABS: 50.0000 mg | ORAL_TABLET | Freq: Every day | ORAL | 1 refills | Status: DC

## 2021-10-15 MED ORDER — SERTRALINE HCL 100 MG PO TABS: ORAL_TABLET | ORAL | 1 refills | Status: DC

## 2021-10-15 NOTE — Patient Instructions (Signed)

## 2021-10-15 NOTE — Progress Notes (Signed)
   Subjective:    Patient ID: Valerie Elliott, female    DOB: Apr 29, 1965, 56 y.o.   MRN: [id number]  HPI Primary hypertension - Plan: losartan (COZAAR) 50 MG tablet, Basic metabolic panel, Microalbumin / creatinine urine ratio  Hyperlipidemia, unspecified hyperlipidemia type - Plan: rosuvastatin (CRESTOR) 10 MG tablet, Lipid panel, Microalbumin / creatinine urine ratio  Anxiety and depression - Plan: sertraline (ZOLOFT) 100 MG tablet  Essential hypertension - Plan: losartan (COZAAR) 50 MG tablet, indapamide (LOZOL) 1.25 MG tablet  Seizure disorder (HCC) - Plan: levETIRAcetam (KEPPRA) 500 MG tablet  Screening for colon cancer - Plan: Ambulatory referral to Gastroenterology  Need for vaccination - Plan: Flu Vaccine QUAD 47moIM (Fluarix, Fluzone & Alfiuria Quad PF)  She relates her moods overall are doing well.  Not having any significant depression   She also relates taking her blood pressure medicine regular basis trying to watch her diet minimizing salt and has lost a couple pounds Takes her cholesterol medicine regular basis tries minimize fried foods Has not had any seizures tolerating Keppra well Is due for colon screening because of family history   Review of Systems     Objective:   Physical Exam General-in no acute distress Eyes-no discharge Lungs-respiratory rate normal, CTA CV-no murmurs,RRR Extremities skin warm dry no edema Neuro grossly normal Behavior normal, alert  Extremities no edema skin warm dry blood pressure good      Assessment & Plan:  1. Primary hypertension Blood pressure good control minimize salt watch diet minimize portion to stay active - losartan (COZAAR) 50 MG tablet; Take 1 tablet (50 mg total) by mouth daily.  Dispense: 90 tablet; Refill: 1 - Basic metabolic panel - Microalbumin / creatinine urine ratio  2. Hyperlipidemia, unspecified hyperlipidemia type Check lab work continue cholesterol medicine healthy diet recommended -  rosuvastatin (CRESTOR) 10 MG tablet; Take 1 tablet (10 mg total) by mouth daily.  Dispense: 90 tablet; Refill: 1 - Lipid panel - Microalbumin / creatinine urine ratio  3. Anxiety and depression Overall doing fairly well reduce sertraline to 1/day give uKoreafeedback in a month how things are going at that time possibly reduce it down to a half a tablet - sertraline (ZOLOFT) 100 MG tablet; 1 qd  Dispense: 90 tablet; Refill: 1  4. Essential hypertension Please see per above continue blood pressure medicine - losartan (COZAAR) 50 MG tablet; Take 1 tablet (50 mg total) by mouth daily.  Dispense: 90 tablet; Refill: 1 - indapamide (LOZOL) 1.25 MG tablet; Take 1 tablet (1.25 mg total) by mouth daily.  Dispense: 90 tablet; Refill: 1  5. Seizure disorder ([place] No seizures.  Takes her medicine regular basis. - levETIRAcetam (KEPPRA) 500 MG tablet; Take 1 tablet (500 mg total) by mouth 2 (two) times daily.  Dispense: 180 tablet; Refill: 1  6. Screening for colon cancer Family history colon cancer referral she is due - Ambulatory referral to Gastroenterology  7. Need for vaccination Flu shot today - Flu Vaccine QUAD 680moM (Fluarix, Fluzone & Alfiuria Quad PF)  8. Family history of colon cancer Referral for colon cancer screening

## 2021-10-17 ENCOUNTER — Encounter (INDEPENDENT_AMBULATORY_CARE_PROVIDER_SITE_OTHER): Payer: Self-pay | Admitting: *Deleted

## 2021-11-01 ENCOUNTER — Encounter (INDEPENDENT_AMBULATORY_CARE_PROVIDER_SITE_OTHER): Payer: Self-pay

## 2021-11-01 ENCOUNTER — Other Ambulatory Visit (INDEPENDENT_AMBULATORY_CARE_PROVIDER_SITE_OTHER): Payer: Self-pay

## 2021-11-01 ENCOUNTER — Telehealth (INDEPENDENT_AMBULATORY_CARE_PROVIDER_SITE_OTHER): Payer: Self-pay

## 2021-11-01 DIAGNOSIS — I10 Essential (primary) hypertension: Secondary | ICD-10-CM

## 2021-11-01 DIAGNOSIS — Z860100 Personal history of colon polyps, unspecified: Secondary | ICD-10-CM

## 2021-11-01 MED ORDER — NA SULFATE-K SULFATE-MG SULF 17.5-3.13-1.6 GM/177ML PO SOLN: 1.0000 | Freq: Once | ORAL | 0 refills | Status: AC

## 2021-11-01 NOTE — Telephone Encounter (Signed)
Referring MD/PCP: Dr Sallee Lange  Procedure: Tcs  Reason/Indication: History of colon polyps  Has patient had this procedure before?  Yes   If so, when, by whom and where?  07/25/2016  Is there a family history of colon cancer?  Yes   Who?  What age when diagnosed?  Sister & Brother  Is patient diabetic? If yes, Type 1 or Type 2   No       Does patient have prosthetic heart valve or mechanical valve?  No   Do you have a pacemaker/defibrillator?  No   Has patient ever had endocarditis/atrial fibrillation? No   Does patient use oxygen? No   Has patient had joint replacement within last 12 months?  No   Is patient constipated or do they take laxatives? NO   Does patient have a history of alcohol/drug use?  No   Have you had a stroke/heart attack last 6 mths? No   Do you take medicine for weight loss?  No   For female patients,: have you had a hysterectomy no                       are you post menopausal yes                       do you still have your menstrual cycle no   Is patient on blood thinner such as Coumadin, Plavix and/or Aspirin? No   Medications: Indapamide 1.25 mg daily, Levetiracetam 500 mg bid, Lorazempam 1 mg 1/2 prn , Losartan 50 mg daily, rosuvastatin 10 mg daily, Sertraline 100 mg daily  Allergies: Hydrocodone Dulcolax   Medication Adjustment per Dr Jenetta Downer none   Procedure date & time: 11/15/21 at 815 am

## 2021-11-01 NOTE — Telephone Encounter (Signed)
Valerie Elliott Ann Lavarr President, CMA  ?

## 2021-11-09 ENCOUNTER — Ambulatory Visit (INDEPENDENT_AMBULATORY_CARE_PROVIDER_SITE_OTHER): Payer: BC Managed Care – PPO | Admitting: Nurse Practitioner

## 2021-11-09 ENCOUNTER — Encounter: Payer: Self-pay | Admitting: Nurse Practitioner

## 2021-11-09 VITALS — BP 128/82 | HR 52 | Temp 97.7°F | Ht 68.75 in | Wt 234.0 lb

## 2021-11-09 DIAGNOSIS — K141 Geographic tongue: Secondary | ICD-10-CM

## 2021-11-09 DIAGNOSIS — Z1151 Encounter for screening for human papillomavirus (HPV): Secondary | ICD-10-CM | POA: Diagnosis not present

## 2021-11-09 DIAGNOSIS — E785 Hyperlipidemia, unspecified: Secondary | ICD-10-CM | POA: Diagnosis not present

## 2021-11-09 DIAGNOSIS — I10 Essential (primary) hypertension: Secondary | ICD-10-CM | POA: Diagnosis not present

## 2021-11-09 DIAGNOSIS — B370 Candidal stomatitis: Secondary | ICD-10-CM

## 2021-11-09 DIAGNOSIS — E559 Vitamin D deficiency, unspecified: Secondary | ICD-10-CM

## 2021-11-09 DIAGNOSIS — Z79899 Other long term (current) drug therapy: Secondary | ICD-10-CM | POA: Diagnosis not present

## 2021-11-09 DIAGNOSIS — Z124 Encounter for screening for malignant neoplasm of cervix: Secondary | ICD-10-CM

## 2021-11-09 DIAGNOSIS — Z01419 Encounter for gynecological examination (general) (routine) without abnormal findings: Secondary | ICD-10-CM | POA: Diagnosis not present

## 2021-11-09 MED ORDER — FLUCONAZOLE 100 MG PO TABS: 100.0000 mg | ORAL_TABLET | Freq: Every day | ORAL | 0 refills | Status: AC

## 2021-11-09 NOTE — Progress Notes (Signed)
   Subjective:    Patient ID: Valerie Elliott, female    DOB: November 17, 1965, 56 y.o.   MRN: [id number]  HPI    Review of Systems     Objective:   Physical Exam        Assessment & Plan:

## 2021-11-09 NOTE — Progress Notes (Signed)
Subjective:    Patient ID: Valerie Elliott, female    DOB: May 08, 1965, 56 y.o.   MRN: [id number]  HPI The patient comes in today for a wellness visit.    A review of their health history was completed.  A review of medications was also completed.  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   Additional concerns: pap today   HPI: Annual well-woman exam today with PAP. Reports colonoscopy scheduled for next week. Expressed concern regarding two brothers, one sister, and mother all having died from colon cancer. Recalls father also having surgery to remove parts of his stomach. Became tearful discussing this. Dr. Nicki Reaper recently decreased her Zoloft dose down to 100 mg last visit which she states has been fine with no concerns. Reports sleeping less. Denies snoring. Seeing dermatology for concern with hair breakage and balding. Has not had sex in last 6 months. No vaginal bleeding. Active outside with dogs and yardwork. Cutting back on sugary drinks. No seizure activity reported in past year. Reports tongue irritation for months that rubs off when brushes teeth. Reports rubbing tongue on upper partial and grinding teeth at night. Uses Listerine mouthwash but has noticed no improvement. Denies sore throat.    Review of Systems  Constitutional:  Negative for activity change, appetite change, fatigue, fever and unexpected weight change.  HENT:  Negative for sore throat and trouble swallowing.        Reports tongue coating and mild irritation.   Respiratory:  Negative for cough, chest tightness, shortness of breath and wheezing.   Cardiovascular:  Negative for chest pain and palpitations.  Gastrointestinal:  Negative for abdominal distention, abdominal pain, blood in stool, constipation,  diarrhea, nausea and vomiting.  Endocrine:       Reports increased urination at night.   Genitourinary:  Positive for frequency. Negative for difficulty urinating, dysuria, enuresis, genital sores, menstrual problem, pelvic pain, urgency, vaginal bleeding and vaginal discharge.  Musculoskeletal:  Negative for arthralgias and back pain.  Neurological:  Negative for dizziness, seizures, light-headedness and headaches.  Psychiatric/Behavioral:  Positive for sleep disturbance. The patient is not nervous/anxious.       Objective:   Vitals:   11/09/21 0825  BP: 128/82  Pulse: (!) 52  Temp: 97.7 F (36.5 C)  Height: 5' 8.75" (1.746 m)  Weight: 106.1 kg  SpO2: 98%  BMI (Calculated): 34.82     Physical Exam Vitals and nursing note reviewed. Exam conducted with a chaperone present.  Constitutional:      General: She is not in acute distress.    Appearance: She is well-developed.     Comments: 15 lb. Weight decrease from January 2023.  HENT:     Head:     Comments: Small 1" patch of mild hair loss on top of scalp.     Right Ear: Tympanic membrane normal.     Left Ear: Tympanic membrane normal.     Nose: No congestion or rhinorrhea.     Mouth/Throat:     Mouth: Mucous membranes are moist.     Comments: Confluent white coating on approximately 2/3 of the tongue with geographical changes on the right lateral and tip of the tongue.  Eyes:     Conjunctiva/sclera: Conjunctivae normal.  Neck:     Thyroid: No thyromegaly.  Trachea: No tracheal deviation.     Comments: Thyroid non tender to palpation. No mass or goiter noted.  Cardiovascular:     Rate and Rhythm: Normal rate and regular rhythm.     Heart sounds: Normal heart sounds. No murmur heard. Pulmonary:     Effort: Pulmonary effort is normal.     Breath sounds: Normal breath sounds. No wheezing.  Chest:  Breasts:    Right: No swelling, inverted nipple, mass, skin change or tenderness.     Left: No swelling, inverted nipple,  mass, skin change or tenderness.  Abdominal:     General: There is no distension.     Palpations: Abdomen is soft.     Tenderness: There is no abdominal tenderness.  Genitourinary:    Comments: External GU: no rashes or lesions. Vagina: no discharge. Cervix normal in appearance. No CMT. Bimanual exam: no tenderness or obvious masses. Exam limited due to abdominal girth.  Musculoskeletal:     Cervical back: Normal range of motion and neck supple.  Lymphadenopathy:     Cervical: No cervical adenopathy.     Upper Body:     Right upper body: No supraclavicular, axillary or pectoral adenopathy.     Left upper body: No supraclavicular, axillary or pectoral adenopathy.  Skin:    General: Skin is warm and dry.     Findings: No rash.  Neurological:     Mental Status: She is alert and oriented to person, place, and time.  Psychiatric:        Mood and Affect: Mood normal.        Behavior: Behavior normal.        Thought Content: Thought content normal.        Judgment: Judgment normal.     Assessment & Plan:   Problem List Items Addressed This Visit       Cardiovascular and Mediastinum   HTN (hypertension)   Relevant Orders   CBC with Differential/Platelet   Comprehensive metabolic panel   TSH   Lipid panel   Hemoglobin A1c   Microalbumin/Creatinine Ratio, Urine     Other   Hyperlipidemia   Relevant Orders   CBC with Differential/Platelet   Comprehensive metabolic panel   TSH   Lipid panel   Hemoglobin A1c   Microalbumin/Creatinine Ratio, Urine   Vitamin D deficiency   Relevant Orders   CBC with Differential/Platelet   Comprehensive metabolic panel   TSH   Lipid panel   Hemoglobin A1c   Microalbumin/Creatinine Ratio, Urine   Other Visit Diagnoses     Well woman exam    -  Primary   Relevant Orders   IGP, Aptima HPV   High risk medication use       Relevant Orders   CBC with Differential/Platelet   Comprehensive metabolic panel   TSH   Lipid panel    Hemoglobin A1c   Microalbumin/Creatinine Ratio, Urine   Geographic tongue       Relevant Orders   CBC with Differential/Platelet   Comprehensive metabolic panel   TSH   Lipid panel   Hemoglobin A1c   Microalbumin/Creatinine Ratio, Urine   Oral candidiasis       Relevant Medications   fluconazole (DIFLUCAN) 100 MG tablet   Other Relevant Orders   CBC with Differential/Platelet   Comprehensive metabolic panel   TSH   Lipid panel   Hemoglobin A1c   Microalbumin/Creatinine Ratio, Urine   Screening for cervical cancer       Relevant  Orders   IGP, Aptima HPV   Screening for HPV (human papillomavirus)       Relevant Orders   IGP, Aptima HPV       Meds ordered this encounter  Medications   fluconazole (DIFLUCAN) 100 MG tablet    Sig: Take 1 tablet (100 mg total) by mouth daily for 14 days.    Dispense:  14 tablet    Refill:  0    Order Specific Question:   Supervising Provider    Answer:   Kathyrn Drown [phone]    New order for Diflucan 100 mg po daily x 14 days. Discussed genetic testing related to family history of cancer. Patient will consider this.  Dermatology follow up for hair loss. Continue healthy lifestyle habits and decreasing sugary drinks. No indication for sleep study at this time. Mammogram normal 08/27/21. Return in about 3 months (around 02/09/2022).

## 2021-11-10 LAB — CBC WITH DIFFERENTIAL/PLATELET
Basophils Absolute: 0.1 10*3/uL (ref 0.0–0.2)
Basos: 1 %
EOS (ABSOLUTE): 0.1 10*3/uL (ref 0.0–0.4)
Eos: 1 %
Hematocrit: 40.4 % (ref 34.0–46.6)
Hemoglobin: 13.2 g/dL (ref 11.1–15.9)
Immature Grans (Abs): 0 10*3/uL (ref 0.0–0.1)
Immature Granulocytes: 0 %
Lymphocytes Absolute: 2.2 10*3/uL (ref 0.7–3.1)
Lymphs: 32 %
MCH: 28.8 pg (ref 26.6–33.0)
MCHC: 32.7 g/dL (ref 31.5–35.7)
MCV: 88 fL (ref 79–97)
Monocytes Absolute: 0.6 10*3/uL (ref 0.1–0.9)
Monocytes: 9 %
Neutrophils Absolute: 3.8 10*3/uL (ref 1.4–7.0)
Neutrophils: 57 %
Platelets: 341 10*3/uL (ref 150–450)
RBC: 4.59 x10E6/uL (ref 3.77–5.28)
RDW: 13.8 % (ref 11.7–15.4)
WBC: 6.7 10*3/uL (ref 3.4–10.8)

## 2021-11-10 LAB — MICROALBUMIN / CREATININE URINE RATIO
Creatinine, Urine: 157.4 mg/dL
Microalb/Creat Ratio: 2 mg/g creat (ref 0–29)
Microalbumin, Urine: 3.6 ug/mL

## 2021-11-10 LAB — COMPREHENSIVE METABOLIC PANEL
ALT: 16 IU/L (ref 0–32)
AST: 18 IU/L (ref 0–40)
Albumin/Globulin Ratio: 1.6 (ref 1.2–2.2)
Albumin: 4.4 g/dL (ref 3.8–4.9)
Alkaline Phosphatase: 82 IU/L (ref 44–121)
BUN/Creatinine Ratio: 12 (ref 9–23)
BUN: 13 mg/dL (ref 6–24)
Bilirubin Total: 0.6 mg/dL (ref 0.0–1.2)
CO2: 27 mmol/L (ref 20–29)
Calcium: 9.8 mg/dL (ref 8.7–10.2)
Chloride: 101 mmol/L (ref 96–106)
Creatinine, Ser: 1.09 mg/dL — ABNORMAL HIGH (ref 0.57–1.00)
Globulin, Total: 2.8 g/dL (ref 1.5–4.5)
Glucose: 87 mg/dL (ref 70–99)
Potassium: 3.8 mmol/L (ref 3.5–5.2)
Sodium: 143 mmol/L (ref 134–144)
Total Protein: 7.2 g/dL (ref 6.0–8.5)
eGFR: 60 mL/min/{1.73_m2} (ref >59)

## 2021-11-10 LAB — LIPID PANEL
Chol/HDL Ratio: 2.7 ratio (ref 0.0–4.4)
Cholesterol, Total: 146 mg/dL (ref 100–199)
HDL: 54 mg/dL (ref >39)
LDL Chol Calc (NIH): 75 mg/dL (ref 0–99)
Triglycerides: 93 mg/dL (ref 0–149)
VLDL Cholesterol Cal: 17 mg/dL (ref 5–40)

## 2021-11-10 LAB — HEMOGLOBIN A1C
Est. average glucose Bld gHb Est-mCnc: 126 mg/dL
Hgb A1c MFr Bld: 6 % — ABNORMAL HIGH (ref 4.8–5.6)

## 2021-11-10 LAB — TSH: TSH: 3.49 u[IU]/mL (ref 0.450–4.500)

## 2021-11-12 NOTE — Patient Instructions (Signed)
Valerie Elliott  11/12/2021     '@PREFPERIOPPHARMACY'$ @   Your procedure is scheduled on  11/15/2021.   Report to [facility] at  [phone]  A.M.   Call this number if you have problems the morning of surgery:  [phone]   Remember:  Follow the diet and prep instructions given to you by the office.    Take these medicines the morning of surgery with A SIP OF WATER                             keppra, ativan (if needed), zoloft.     Do not wear jewelry, make-up or nail polish.  Do not wear lotions, powders, or perfumes, or deodorant.  Do not shave 48 hours prior to surgery.  Men may shave face and neck.  Do not bring valuables to the hospital.  [facility] is not responsible for any belongings or valuables.  Contacts, dentures or bridgework may not be worn into surgery.  Leave your suitcase in the car.  After surgery it may be brought to your room.  For patients admitted to the hospital, discharge time will be determined by your treatment team.  Patients discharged the day of surgery will not be allowed to drive home and must have someone with them for 24 hours.    Special instructions:   DO NOT smoke tobacco or vape for 24 hours before your procedure.  Please read over the following fact sheets that you were given. Anesthesia Post-op Instructions and Care and Recovery After Surgery      Colonoscopy, Adult, Care After The following information offers guidance on how to care for yourself after your procedure. Your health care provider may also give you more specific instructions. If you have problems or questions, contact your health care provider. What can I expect after the procedure? After the procedure, it is common to have: A small amount of blood in your stool for 24 hours after the procedure. Some gas. Mild cramping or bloating of your abdomen. Follow these instructions at home: Eating and drinking  Drink enough fluid to keep your urine pale yellow. Follow  instructions from your health care provider about eating or drinking restrictions. Resume your normal diet as told by your health care provider. Avoid heavy or fried foods that are hard to digest. Activity Rest as told by your health care provider. Avoid sitting for a long time without moving. Get up to take short walks every 1-2 hours. This is important to improve blood flow and breathing. Ask for help if you feel weak or unsteady. Return to your normal activities as told by your health care provider. Ask your health care provider what activities are safe for you. Managing cramping and bloating  Try walking around when you have cramps or feel bloated. If directed, apply heat to your abdomen as told by your health care provider. Use the heat source that your health care provider recommends, such as a moist heat pack or a heating pad. Place a towel between your skin and the heat source. Leave the heat on for 20-30 minutes. Remove the heat if your skin turns bright red. This is especially important if you are unable to feel pain, heat, or cold. You have a greater risk of getting burned. General instructions If you were given a sedative during the procedure, it can affect you for several hours. Do not drive or  operate machinery until your health care provider says that it is safe. For the first 24 hours after the procedure: Do not sign important documents. Do not drink alcohol. Do your regular daily activities at a slower pace than normal. Eat soft foods that are easy to digest. Take over-the-counter and prescription medicines only as told by your health care provider. Keep all follow-up visits. This is important. Contact a health care provider if: You have blood in your stool 2-3 days after the procedure. Get help right away if: You have more than a small spotting of blood in your stool. You have large blood clots in your stool. You have swelling of your abdomen. You have nausea or  vomiting. You have a fever. You have increasing pain in your abdomen that is not relieved with medicine. These symptoms may be an emergency. Get help right away. Call 911. Do not wait to see if the symptoms will go away. Do not drive yourself to the hospital. Summary After the procedure, it is common to have a small amount of blood in your stool. You may also have mild cramping and bloating of your abdomen. If you were given a sedative during the procedure, it can affect you for several hours. Do not drive or operate machinery until your health care provider says that it is safe. Get help right away if you have a lot of blood in your stool, nausea or vomiting, a fever, or increased pain in your abdomen. This information is not intended to replace advice given to you by your health care provider. Make sure you discuss any questions you have with your health care provider. Document Revised: 09/13/2020 Document Reviewed: 09/13/2020 Elsevier Patient Education  [name] After This sheet gives you information about how to care for yourself after your procedure. Your health care provider may also give you more specific instructions. If you have problems or questions, contact your health care provider. What can I expect after the procedure? After the procedure, it is common to have: Tiredness. Forgetfulness about what happened after the procedure. Impaired judgment for important decisions. Nausea or vomiting. Some difficulty with balance. Follow these instructions at home: For the time period you were told by your health care provider:     Rest as needed. Do not participate in activities where you could fall or become injured. Do not drive or use machinery. Do not drink alcohol. Do not take sleeping pills or medicines that cause drowsiness. Do not make important decisions or sign legal documents. Do not take care of children on your own. Eating and  drinking Follow the diet that is recommended by your health care provider. Drink enough fluid to keep your urine pale yellow. If you vomit: Drink water, juice, or soup when you can drink without vomiting. Make sure you have little or no nausea before eating solid foods. General instructions Have a responsible adult stay with you for the time you are told. It is important to have someone help care for you until you are awake and alert. Take over-the-counter and prescription medicines only as told by your health care provider. If you have sleep apnea, surgery and certain medicines can increase your risk for breathing problems. Follow instructions from your health care provider about wearing your sleep device: Anytime you are sleeping, including during daytime naps. While taking prescription pain medicines, sleeping medicines, or medicines that make you drowsy. Avoid smoking. Keep all follow-up visits as told by your health  care provider. This is important. Contact a health care provider if: You keep feeling nauseous or you keep vomiting. You feel light-headed. You are still sleepy or having trouble with balance after 24 hours. You develop a rash. You have a fever. You have redness or swelling around the IV site. Get help right away if: You have trouble breathing. You have new-onset confusion at home. Summary For several hours after your procedure, you may feel tired. You may also be forgetful and have poor judgment. Have a responsible adult stay with you for the time you are told. It is important to have someone help care for you until you are awake and alert. Rest as told. Do not drive or operate machinery. Do not drink alcohol or take sleeping pills. Get help right away if you have trouble breathing, or if you suddenly become confused. This information is not intended to replace advice given to you by your health care provider. Make sure you discuss any questions you have with your  health care provider. Document Revised: 12/26/2020 Document Reviewed: 12/24/2018 Elsevier Patient Education  [place].

## 2021-11-13 ENCOUNTER — Encounter (HOSPITAL_COMMUNITY)
Admission: RE | Admit: 2021-11-13 | Discharge: 2021-11-13 | Disposition: A | Discharge status: Home / Self Care | Payer: BC Managed Care – PPO | Source: Ambulatory Visit | Attending: Gastroenterology | Admitting: Gastroenterology

## 2021-11-13 VITALS — BP 115/70 | HR 56 | Temp 97.7°F | Resp 18 | Ht 68.5 in | Wt 234.0 lb

## 2021-11-13 DIAGNOSIS — Z01810 Encounter for preprocedural cardiovascular examination: Secondary | ICD-10-CM | POA: Insufficient documentation

## 2021-11-13 DIAGNOSIS — Z79899 Other long term (current) drug therapy: Secondary | ICD-10-CM | POA: Diagnosis not present

## 2021-11-13 DIAGNOSIS — Z1211 Encounter for screening for malignant neoplasm of colon: Secondary | ICD-10-CM | POA: Diagnosis not present

## 2021-11-13 DIAGNOSIS — F418 Other specified anxiety disorders: Secondary | ICD-10-CM | POA: Diagnosis not present

## 2021-11-13 DIAGNOSIS — I10 Essential (primary) hypertension: Secondary | ICD-10-CM

## 2021-11-13 DIAGNOSIS — K5730 Diverticulosis of large intestine without perforation or abscess without bleeding: Secondary | ICD-10-CM | POA: Diagnosis not present

## 2021-11-13 DIAGNOSIS — D122 Benign neoplasm of ascending colon: Secondary | ICD-10-CM | POA: Diagnosis not present

## 2021-11-13 DIAGNOSIS — Z800 Family history of malignant neoplasm of digestive organs: Secondary | ICD-10-CM | POA: Diagnosis not present

## 2021-11-13 DIAGNOSIS — K648 Other hemorrhoids: Secondary | ICD-10-CM | POA: Diagnosis not present

## 2021-11-14 LAB — IGP, APTIMA HPV: HPV Aptima: POSITIVE — AB

## 2021-11-15 ENCOUNTER — Encounter (HOSPITAL_COMMUNITY)
Admission: RE | Disposition: A | Discharge status: Home / Self Care | Payer: Self-pay | Source: Ambulatory Visit | Attending: Gastroenterology

## 2021-11-15 ENCOUNTER — Ambulatory Visit (HOSPITAL_COMMUNITY): Payer: BC Managed Care – PPO | Admitting: Anesthesiology

## 2021-11-15 ENCOUNTER — Ambulatory Visit (HOSPITAL_COMMUNITY)
Admission: RE | Admit: 2021-11-15 | Discharge: 2021-11-15 | Disposition: A | Discharge status: Home / Self Care | Payer: BC Managed Care – PPO | Source: Ambulatory Visit | Attending: Gastroenterology | Admitting: Gastroenterology

## 2021-11-15 DIAGNOSIS — I10 Essential (primary) hypertension: Secondary | ICD-10-CM | POA: Insufficient documentation

## 2021-11-15 DIAGNOSIS — K635 Polyp of colon: Secondary | ICD-10-CM | POA: Diagnosis not present

## 2021-11-15 DIAGNOSIS — K5730 Diverticulosis of large intestine without perforation or abscess without bleeding: Secondary | ICD-10-CM | POA: Diagnosis not present

## 2021-11-15 DIAGNOSIS — Z800 Family history of malignant neoplasm of digestive organs: Secondary | ICD-10-CM | POA: Insufficient documentation

## 2021-11-15 DIAGNOSIS — Z86010 Personal history of colonic polyps: Secondary | ICD-10-CM

## 2021-11-15 DIAGNOSIS — Z1211 Encounter for screening for malignant neoplasm of colon: Secondary | ICD-10-CM | POA: Insufficient documentation

## 2021-11-15 DIAGNOSIS — K648 Other hemorrhoids: Secondary | ICD-10-CM | POA: Insufficient documentation

## 2021-11-15 DIAGNOSIS — Z79899 Other long term (current) drug therapy: Secondary | ICD-10-CM | POA: Insufficient documentation

## 2021-11-15 DIAGNOSIS — D122 Benign neoplasm of ascending colon: Secondary | ICD-10-CM | POA: Insufficient documentation

## 2021-11-15 DIAGNOSIS — F418 Other specified anxiety disorders: Secondary | ICD-10-CM | POA: Insufficient documentation

## 2021-11-15 DIAGNOSIS — D121 Benign neoplasm of appendix: Secondary | ICD-10-CM | POA: Diagnosis not present

## 2021-11-15 HISTORY — PX: POLYPECTOMY: SHX5525

## 2021-11-15 HISTORY — PX: COLONOSCOPY WITH PROPOFOL: SHX5780

## 2021-11-15 HISTORY — PX: BIOPSY: SHX5522

## 2021-11-15 LAB — HM COLONOSCOPY

## 2021-11-15 SURGERY — COLONOSCOPY WITH PROPOFOL
Anesthesia: General

## 2021-11-15 MED ORDER — PROPOFOL 500 MG/50ML IV EMUL
INTRAVENOUS | Status: DC | PRN
  Administered 2021-11-15: 150 ug/kg/min via INTRAVENOUS

## 2021-11-15 MED ORDER — PROPOFOL 10 MG/ML IV BOLUS
INTRAVENOUS | Status: DC | PRN
  Administered 2021-11-15: 70 mg via INTRAVENOUS

## 2021-11-15 MED ORDER — LACTATED RINGERS IV SOLN: INTRAVENOUS | Status: DC

## 2021-11-15 MED ORDER — EPHEDRINE SULFATE (PRESSORS) 50 MG/ML IJ SOLN
INTRAMUSCULAR | Status: DC | PRN
  Administered 2021-11-15: 5 mg via INTRAVENOUS

## 2021-11-15 NOTE — Discharge Instructions (Signed)
You are being discharged to home.  Resume your previous diet.  We are waiting for your pathology results.  Your physician has recommended a repeat colonoscopy for surveillance based on pathology results.  

## 2021-11-15 NOTE — Anesthesia Preprocedure Evaluation (Signed)
Anesthesia Evaluation  Patient identified by MRN, date of birth, ID band Patient awake    Reviewed: Allergy & Precautions, NPO status , Patient's Chart, lab work & pertinent test results  Airway Mallampati: II  TM Distance: >3 FB Neck ROM: Full   Comment: Cervical disc disorder with radiculopathy of cervical region Dental  (+) Dental Advisory Given, Partial Upper   Pulmonary neg pulmonary ROS,    Pulmonary exam normal breath sounds clear to auscultation       Cardiovascular hypertension, Pt. on medications Normal cardiovascular exam Rhythm:Regular Rate:Normal     Neuro/Psych Seizures -, Well Controlled,  PSYCHIATRIC DISORDERS Anxiety Depression  Neuromuscular disease    GI/Hepatic negative GI ROS, Neg liver ROS,   Endo/Other  negative endocrine ROS  Renal/GU negative Renal ROS  negative genitourinary   Musculoskeletal negative musculoskeletal ROS (+)   Abdominal   Peds negative pediatric ROS (+)  Hematology negative hematology ROS (+)   Anesthesia Other Findings   Reproductive/Obstetrics negative OB ROS                             Anesthesia Physical Anesthesia Plan  ASA: 2  Anesthesia Plan: General   Post-op Pain Management: Minimal or no pain anticipated   Induction: Intravenous  PONV Risk Score and Plan: TIVA  Airway Management Planned: Nasal Cannula and Natural Airway  Additional Equipment:   Intra-op Plan:   Post-operative Plan:   Informed Consent: I have reviewed the patients History and Physical, chart, labs and discussed the procedure including the risks, benefits and alternatives for the proposed anesthesia with the patient or authorized representative who has indicated his/her understanding and acceptance.     Dental advisory given  Plan Discussed with: CRNA and Surgeon  Anesthesia Plan Comments:         Anesthesia Quick Evaluation

## 2021-11-15 NOTE — Transfer of Care (Signed)
Immediate Anesthesia Transfer of Care Note  Patient: Valerie Elliott  Procedure(s) Performed: COLONOSCOPY WITH PROPOFOL POLYPECTOMY BIOPSY  Patient Location: PACU and Short Stay  Anesthesia Type:General  Level of Consciousness: awake, alert , oriented and patient cooperative  Airway & Oxygen Therapy: Patient Spontanous Breathing  Post-op Assessment: Report given to RN, Post -op Vital signs reviewed and stable and Patient moving all extremities X 4  Post vital signs: Reviewed and stable  Last Vitals:  Vitals Value Taken Time  BP 104/48 11/15/21 0854  Temp 36.3 C 11/15/21 0854  Pulse 65 11/15/21 0854  Resp 17 11/15/21 0854  SpO2 97 % 11/15/21 0854    Last Pain:  Vitals:   11/15/21 0854  TempSrc: Axillary  PainSc: 0-No pain      Patients Stated Pain Goal: 7 (11/57/26 2035)  Complications: No notable events documented.

## 2021-11-15 NOTE — Anesthesia Postprocedure Evaluation (Signed)
Anesthesia Post Note  Patient: Valerie Elliott  Procedure(s) Performed: COLONOSCOPY WITH PROPOFOL POLYPECTOMY BIOPSY  Patient location during evaluation: Phase II Anesthesia Type: General Level of consciousness: awake and alert and oriented Pain management: pain level controlled Vital Signs Assessment: post-procedure vital signs reviewed and stable Respiratory status: spontaneous breathing, nonlabored ventilation and respiratory function stable Cardiovascular status: blood pressure returned to baseline and stable Postop Assessment: no apparent nausea or vomiting Anesthetic complications: no   No notable events documented.   Last Vitals:  Vitals:   11/15/21 0714 11/15/21 0854  BP: (!) 142/70 (!) 104/48  Pulse: (!) 50 65  Resp: 15 17  Temp: 37.2 C (!) 36.3 C  SpO2: 96% 97%    Last Pain:  Vitals:   11/15/21 0854  TempSrc: Axillary  PainSc: 0-No pain                 Coby Antrobus C Modell Fendrick

## 2021-11-15 NOTE — Op Note (Addendum)
[facility] Patient Name: Valerie Elliott Procedure Date: 11/15/2021 8:17 AM MRN: [id number] Date of Birth: 1965/08/11 Attending MD: Maylon Peppers ,  CSN: [id number] Age: 56 Admit Type: Outpatient Procedure:                Colonoscopy Indications:              Screening patient at increased risk: Family history                            of colorectal cancer in multiple 1st-degree                            relatives Providers:                Maylon Peppers, Rosina Lowenstein, RN, Everardo Pacific Referring MD:              Medicines:                Monitored Anesthesia Care Complications:            No immediate complications. Estimated Blood Loss:     Estimated blood loss: none. Procedure:                Pre-Anesthesia Assessment:                           - Prior to the procedure, a History and Physical                            was performed, and patient medications, allergies                            and sensitivities were reviewed. The patient's                            tolerance of previous anesthesia was reviewed.                           - The risks and benefits of the procedure and the                            sedation options and risks were discussed with the                            patient. All questions were answered and informed                            consent was obtained.                           - ASA Grade Assessment: II - A patient with mild                            systemic disease.                           After obtaining informed consent, the colonoscope  was passed under direct vision. Throughout the                            procedure, the patient's blood pressure, pulse, and                            oxygen saturations were monitored continuously. The                            PCF-HQ190L ([id number]) was introduced through the                            anus and advanced to the the cecum, identified by                             appendiceal orifice and ileocecal valve. The                            colonoscopy was performed without difficulty. The                            patient tolerated the procedure well. The quality                            of the bowel preparation was good. Scope In: 8:31:03 AM Scope Out: 8:51:05 AM Scope Withdrawal Time: 0 hours 15 minutes 55 seconds  Total Procedure Duration: 0 hours 20 minutes 2 seconds  Findings:      The perianal and digital rectal examinations were normal.      A 2 mm polyp was found in the appendiceal orifice. The polyp was       sessile. The polyp was removed with a jumbo cold forceps. Resection and       retrieval were complete.      Two sessile polyps were found in the ascending colon. The polyps were 4       mm in size. These polyps were removed with a cold snare. Resection and       retrieval were complete.      A few small-mouthed diverticula were found in the sigmoid colon,       descending colon and ascending colon.      Non-bleeding internal hemorrhoids were found during retroflexion. The       hemorrhoids were small. Impression:               - One 2 mm polyp at the appendiceal orifice,                            removed with a jumbo cold forceps. Resected and                            retrieved.                           - Two 4 mm polyps in the ascending colon, removed  with a cold snare. Resected and retrieved.                           - Diverticulosis in the sigmoid colon, in the                            descending colon and in the ascending colon.                           - Non-bleeding internal hemorrhoids. Moderate Sedation:      Per Anesthesia Care Recommendation:           - Discharge patient to home (ambulatory).                           - Resume previous diet.                           - Await pathology results.                           - Repeat colonoscopy for surveillance based on                             pathology results. Procedure Code(s):        --- Professional ---                           [phone], Colonoscopy, flexible; with removal of                            tumor(s), polyp(s), or other lesion(s) by snare                            technique                           45380, 62, Colonoscopy, flexible; with biopsy,                            single or multiple Diagnosis Code(s):        --- Professional ---                           K63.5, Polyp of colon                           Z80.0, Family history of malignant neoplasm of                            digestive organs                           K64.8, Other hemorrhoids                           K57.30, Diverticulosis of large intestine without  perforation or abscess without bleeding CPT copyright 2019 American [employer] Association. All rights reserved. The codes documented in this report are preliminary and upon coder review may  be revised to meet current compliance requirements. Maylon Peppers, MD Maylon Peppers,  11/15/2021 8:56:34 AM This report has been signed electronically. Number of Addenda: 0

## 2021-11-15 NOTE — H&P (Addendum)
Valerie Elliott is an 56 y.o. female.   Chief Complaint: family history of colon cancer and history of colon polyps. HPI: 56 y/o F with PMH anxiety, HTN and seizures, coming for family history of colon cancer . T last colonoscopy in 2018, had 2 polyps removed but they were lymphoid aggregates.  The patient denies having any complaints such as melena, hematochezia, abdominal pain or distention, change in her bowel movement consistency or frequency, no changes in weight recently.  Both her sister and grandfather were diagnosed with colon cancer in their 44s.   Past Medical History:  Diagnosis Date   Anxiety    Hypertension    Seasonal allergies    Seizures ([place])     Past Surgical History:  Procedure Laterality Date   CHOLECYSTECTOMY  04/04/2008   COLONOSCOPY N/A 07/25/2016   Procedure: COLONOSCOPY;  Surgeon: Rogene Houston, MD;  Location: [facility];  Service: Endoscopy;  Laterality: N/A;  1030   POLYPECTOMY  07/25/2016   Procedure: POLYPECTOMY;  Surgeon: Rogene Houston, MD;  Location: [facility];  Service: Endoscopy;;  colon    Family History  Problem Relation Age of Onset   Colon cancer Sister    Colon cancer Brother    Social History:  reports that she has never smoked. She has never used smokeless tobacco. She reports that she does not drink alcohol and does not use drugs.  Allergies:  Allergies  Allergen Reactions   Hydrocodone Hives   Dulcolax [Bisacodyl] Palpitations    Medications Prior to Admission  Medication Sig Dispense Refill   indapamide (LOZOL) 1.25 MG tablet Take 1 tablet (1.25 mg total) by mouth daily. 90 tablet 1   levETIRAcetam (KEPPRA) 500 MG tablet Take 1 tablet (500 mg total) by mouth 2 (two) times daily. 180 tablet 1   LORazepam (ATIVAN) 1 MG tablet TAKE 1/2 (ONE-HALF) TABLET BY MOUTH TWICE DAILY FOR ANXIETY USE  SPARINGLY 15 tablet 0   losartan (COZAAR) 50 MG tablet Take 1 tablet (50 mg total) by mouth daily. 90 tablet 1   rosuvastatin (CRESTOR)  10 MG tablet Take 1 tablet (10 mg total) by mouth daily. 90 tablet 1   sertraline (ZOLOFT) 100 MG tablet 1 qd 90 tablet 1   fluconazole (DIFLUCAN) 100 MG tablet Take 1 tablet (100 mg total) by mouth daily for 14 days. 14 tablet 0    No results found for this or any previous visit (from the past 48 hour(s)). No results found.  Review of Systems  All other systems reviewed and are negative.   Blood pressure (!) 142/70, pulse (!) 50, temperature 99 F (37.2 C), temperature source Oral, resp. rate 15, height 5' 8.5" (1.74 m), weight 106.1 kg, last menstrual period 03/26/2017, SpO2 96 %. Physical Exam  GENERAL: The patient is AO x3, in no acute distress. HEENT: Head is normocephalic and atraumatic. EOMI are intact. Mouth is well hydrated and without lesions. NECK: Supple. No masses LUNGS: Clear to auscultation. No presence of rhonchi/wheezing/rales. Adequate chest expansion HEART: RRR, normal s1 and s2. ABDOMEN: Soft, nontender, no guarding, no peritoneal signs, and nondistended. BS +. No masses. EXTREMITIES: Without any cyanosis, clubbing, rash, lesions or edema. NEUROLOGIC: AOx3, no focal motor deficit. SKIN: no jaundice, no rashes  Assessment/Plan 57 y/o F with PMH anxiety, HTN and seizures, coming for family history of colon cancer.  We will proceed with colonoscopy.  Harvel Quale, MD 11/15/2021, 7:35 AM

## 2021-11-19 LAB — SURGICAL PATHOLOGY

## 2021-11-20 ENCOUNTER — Ambulatory Visit: Payer: BC Managed Care – PPO | Admitting: Family Medicine

## 2021-11-21 ENCOUNTER — Encounter (HOSPITAL_COMMUNITY): Payer: Self-pay | Admitting: Gastroenterology

## 2021-11-23 ENCOUNTER — Other Ambulatory Visit: Payer: Self-pay | Admitting: Nurse Practitioner

## 2021-11-23 ENCOUNTER — Encounter: Payer: Self-pay | Admitting: Nurse Practitioner

## 2021-11-23 ENCOUNTER — Encounter (INDEPENDENT_AMBULATORY_CARE_PROVIDER_SITE_OTHER): Payer: Self-pay | Admitting: *Deleted

## 2021-11-23 DIAGNOSIS — R87810 Cervical high risk human papillomavirus (HPV) DNA test positive: Secondary | ICD-10-CM | POA: Insufficient documentation

## 2022-01-30 ENCOUNTER — Other Ambulatory Visit: Payer: Self-pay | Admitting: Family Medicine

## 2022-02-16 DIAGNOSIS — M7918 Myalgia, other site: Secondary | ICD-10-CM | POA: Diagnosis not present

## 2022-04-10 ENCOUNTER — Other Ambulatory Visit: Payer: Self-pay | Admitting: Family Medicine

## 2022-04-10 DIAGNOSIS — E785 Hyperlipidemia, unspecified: Secondary | ICD-10-CM

## 2022-04-25 ENCOUNTER — Other Ambulatory Visit: Payer: Self-pay | Admitting: Family Medicine

## 2022-04-25 DIAGNOSIS — I10 Essential (primary) hypertension: Secondary | ICD-10-CM

## 2022-05-06 ENCOUNTER — Other Ambulatory Visit: Payer: Self-pay | Admitting: Family Medicine

## 2022-05-06 DIAGNOSIS — G40909 Epilepsy, unspecified, not intractable, without status epilepticus: Secondary | ICD-10-CM

## 2022-05-27 ENCOUNTER — Other Ambulatory Visit: Payer: Self-pay | Admitting: Family Medicine

## 2022-05-27 DIAGNOSIS — F32A Depression, unspecified: Secondary | ICD-10-CM

## 2022-07-08 ENCOUNTER — Encounter: Payer: Self-pay | Admitting: Family Medicine

## 2022-07-12 ENCOUNTER — Encounter: Payer: Self-pay | Admitting: Family Medicine

## 2022-07-12 NOTE — Progress Notes (Signed)
Please mail to the patient 

## 2022-07-16 ENCOUNTER — Encounter: Payer: Self-pay | Admitting: Family Medicine

## 2022-07-16 NOTE — Progress Notes (Signed)
Please print the letter call the patient and she can pick it up

## 2022-08-04 ENCOUNTER — Other Ambulatory Visit: Payer: Self-pay | Admitting: Family Medicine

## 2022-08-04 DIAGNOSIS — G40909 Epilepsy, unspecified, not intractable, without status epilepticus: Secondary | ICD-10-CM

## 2022-08-23 ENCOUNTER — Other Ambulatory Visit: Payer: Self-pay | Admitting: Family Medicine

## 2022-10-08 ENCOUNTER — Other Ambulatory Visit: Payer: Self-pay | Admitting: Family Medicine

## 2022-10-08 DIAGNOSIS — E785 Hyperlipidemia, unspecified: Secondary | ICD-10-CM

## 2022-10-14 ENCOUNTER — Other Ambulatory Visit: Payer: Self-pay | Admitting: Family Medicine

## 2022-10-14 DIAGNOSIS — I10 Essential (primary) hypertension: Secondary | ICD-10-CM

## 2022-11-02 ENCOUNTER — Other Ambulatory Visit: Payer: Self-pay | Admitting: Family Medicine

## 2022-11-02 DIAGNOSIS — G40909 Epilepsy, unspecified, not intractable, without status epilepticus: Secondary | ICD-10-CM

## 2022-11-10 ENCOUNTER — Other Ambulatory Visit: Payer: Self-pay | Admitting: Family Medicine

## 2022-11-10 DIAGNOSIS — E785 Hyperlipidemia, unspecified: Secondary | ICD-10-CM

## 2022-11-18 ENCOUNTER — Other Ambulatory Visit: Payer: Self-pay | Admitting: Family Medicine

## 2022-11-18 DIAGNOSIS — F419 Anxiety disorder, unspecified: Secondary | ICD-10-CM

## 2022-11-21 ENCOUNTER — Ambulatory Visit: Payer: BC Managed Care – PPO | Admitting: Nurse Practitioner

## 2022-11-21 VITALS — BP 138/85 | HR 48 | Temp 97.5°F | Ht 68.5 in | Wt 242.6 lb

## 2022-11-21 DIAGNOSIS — G40909 Epilepsy, unspecified, not intractable, without status epilepticus: Secondary | ICD-10-CM

## 2022-11-21 DIAGNOSIS — F419 Anxiety disorder, unspecified: Secondary | ICD-10-CM

## 2022-11-21 DIAGNOSIS — L659 Nonscarring hair loss, unspecified: Secondary | ICD-10-CM

## 2022-11-21 DIAGNOSIS — Z23 Encounter for immunization: Secondary | ICD-10-CM

## 2022-11-21 DIAGNOSIS — I10 Essential (primary) hypertension: Secondary | ICD-10-CM

## 2022-11-21 DIAGNOSIS — Z1329 Encounter for screening for other suspected endocrine disorder: Secondary | ICD-10-CM

## 2022-11-21 DIAGNOSIS — E785 Hyperlipidemia, unspecified: Secondary | ICD-10-CM | POA: Diagnosis not present

## 2022-11-21 DIAGNOSIS — F32A Depression, unspecified: Secondary | ICD-10-CM

## 2022-11-21 DIAGNOSIS — Z130 Encounter for screening for diseases of the blood and blood-forming organs and certain disorders involving the immune mechanism: Secondary | ICD-10-CM

## 2022-11-22 ENCOUNTER — Encounter: Payer: Self-pay | Admitting: Nurse Practitioner

## 2022-11-22 MED ORDER — LORAZEPAM 1 MG PO TABS: ORAL_TABLET | ORAL | 0 refills | Status: DC

## 2022-11-22 MED ORDER — ROSUVASTATIN CALCIUM 10 MG PO TABS: 10.0000 mg | ORAL_TABLET | Freq: Every day | ORAL | 1 refills | Status: DC

## 2022-11-22 MED ORDER — SERTRALINE HCL 100 MG PO TABS: ORAL_TABLET | ORAL | 1 refills | Status: DC

## 2022-11-22 MED ORDER — INDAPAMIDE 1.25 MG PO TABS: 1.2500 mg | ORAL_TABLET | Freq: Every day | ORAL | 1 refills | Status: DC

## 2022-11-22 NOTE — Progress Notes (Signed)
Subjective:    Patient ID: Valerie Elliott, female    DOB: 1965-06-17, 57 y.o.   MRN: [id number]  HPI Presents for routine follow-up of her chronic health issues.  Doing well on Keppra.  No seizures since her last visit.  Adherent to medication regimen.  Very rare use of lorazepam for anxiety.  Sertraline overall working well.  Describes her diet is "not healthy".  No added salt.  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  Plans to schedule her own mammogram.  Is due for her flu vaccine today.  Her only complaint today is chronic hair loss with patches of significant hair loss on the scalp which has been going on long-term.  Has discussed this with at least 2 different specialist.  At 1 point was prescribed Rogaine which did not work.   Review of Systems  Constitutional:  Positive for fatigue.  HENT:  Negative for sore throat and trouble swallowing.   Respiratory:  Negative for cough, chest tightness and shortness of breath.   Cardiovascular:  Positive for leg swelling. Negative for chest pain.       Mild swelling in the lower legs at times.  Denies orthopnea.    11/21/2022    3:58 PM  Depression screen PHQ 2/9  Decreased Interest 2  Down, Depressed, Hopeless 1  PHQ - 2 Score 3  Altered sleeping 2  Tired, decreased energy 2  Change in appetite 1  Feeling bad or failure about yourself  1  Trouble concentrating 2  Moving slowly or fidgety/restless 1  Suicidal thoughts 0  PHQ-9 Score 12  Difficult doing work/chores Not difficult at all      11/21/2022    3:58 PM 06/16/2020    1:44 PM 11/25/2019   12:08 PM 03/06/2018    2:51 PM  GAD 7 : Generalized Anxiety Score  Nervous, Anxious, on Edge 1 2 3 1   Control/stop worrying 2 1  1   Worry too much - different things 1 1 2 1   Trouble relaxing 2 2 3 2   Restless 0 1 1 1   Easily annoyed or irritable 3 2 3 1   Afraid - awful might happen 2 2 3 1   Total GAD 7 Score 11 11  8   Anxiety Difficulty Somewhat  difficult Not difficult at all Very difficult Not difficult at all         Objective:   Physical Exam NAD.  Alert, oriented.  Calm cheerful affect.  Thyroid nontender to palpation, no mass or goiter noted.  Lungs clear.  Heart regular rate rhythm.  No murmur or gallop noted.  Lower extremities mild varicose veins noted both lower extremities with some faint venous stasis color changes noted on the lower legs bilaterally.  Trace pitting edema in the ankle area only.  Several patches of complete hair loss noted on the scalp with no lesions or signs of rash. Today's Vitals   11/21/22 1551  BP: 138/85  Pulse: (!) 48  Temp: (!) 97.5 F (36.4 C)  SpO2: 99%  Weight: 242 lb 9.6 oz (110 kg)  Height: 5' 8.5" (1.74 m)   Body mass index is 36.35 kg/m.       Assessment & Plan:   Problem List Items Addressed This Visit       Cardiovascular and Mediastinum   HTN (hypertension) - Primary   Relevant Medications   indapamide (LOZOL) 1.25 MG tablet   rosuvastatin (CRESTOR) 10 MG tablet  Other Relevant Orders   Comprehensive metabolic panel   Lipid panel     Nervous and Auditory   Seizure disorder (HCC)     Other   Anxiety and depression   Relevant Medications   LORazepam (ATIVAN) 1 MG tablet   sertraline (ZOLOFT) 100 MG tablet   Hair loss   Hyperlipidemia   Relevant Medications   indapamide (LOZOL) 1.25 MG tablet   rosuvastatin (CRESTOR) 10 MG tablet   Other Relevant Orders   Lipid panel   Other Visit Diagnoses     Screening for deficiency anemia       Relevant Orders   CBC with Differential/Platelet   Screening for thyroid disorder       Relevant Orders   TSH   Immunization due       Relevant Orders   Flu vaccine trivalent PF, 6mos and older(Flulaval,Afluria,Fluarix,Fluzone) (Completed)   Essential hypertension       Relevant Medications   indapamide (LOZOL) 1.25 MG tablet   rosuvastatin (CRESTOR) 10 MG tablet      Meds ordered this encounter  Medications    indapamide (LOZOL) 1.25 MG tablet    Sig: Take 1 tablet (1.25 mg total) by mouth daily.    Dispense:  90 tablet    Refill:  1    Order Specific Question:   Supervising Provider    Answer:   Lilyan Punt A [[id number]]   LORazepam (ATIVAN) 1 MG tablet    Sig: TAKE 1/2 (ONE-HALF) TABLET BY MOUTH TWICE DAILY FOR ANXIETY **USE  SPARINGLY**    Dispense:  15 tablet    Refill:  0    Order Specific Question:   Supervising Provider    Answer:   Lilyan Punt A [[id number]]   rosuvastatin (CRESTOR) 10 MG tablet    Sig: Take 1 tablet (10 mg total) by mouth daily.    Dispense:  90 tablet    Refill:  1    Order Specific Question:   Supervising Provider    Answer:   Lilyan Punt A [[id number]]   sertraline (ZOLOFT) 100 MG tablet    Sig: Take 1 tablet by mouth once daily    Dispense:  90 tablet    Refill:  1    Order Specific Question:   Supervising Provider    Answer:   Lilyan Punt A [[id number]]   Patient defers changes in her sertraline at this time.  Discussed importance of regular activity healthy diet and adequate rest.  Flu vaccine today. Consider compression stockings to help with any leg swelling and monitor sodium in her diet. Patient to schedule her own mammogram appointment. Labs pending.  Patient has obvious alopecia, further follow-up or referrals based on lab results. Return in about 6 months (around 05/22/2023).

## 2023-01-16 ENCOUNTER — Other Ambulatory Visit: Payer: Self-pay | Admitting: Family Medicine

## 2023-01-16 DIAGNOSIS — I10 Essential (primary) hypertension: Secondary | ICD-10-CM

## 2023-02-02 ENCOUNTER — Other Ambulatory Visit: Payer: Self-pay | Admitting: Family Medicine

## 2023-02-02 DIAGNOSIS — G40909 Epilepsy, unspecified, not intractable, without status epilepticus: Secondary | ICD-10-CM

## 2023-03-21 ENCOUNTER — Other Ambulatory Visit: Payer: Self-pay | Admitting: Family Medicine

## 2023-04-15 ENCOUNTER — Other Ambulatory Visit: Payer: Self-pay | Admitting: Family Medicine

## 2023-04-15 DIAGNOSIS — I10 Essential (primary) hypertension: Secondary | ICD-10-CM

## 2023-05-14 ENCOUNTER — Other Ambulatory Visit: Payer: Self-pay | Admitting: Nurse Practitioner

## 2023-05-14 DIAGNOSIS — F32A Depression, unspecified: Secondary | ICD-10-CM

## 2023-05-16 ENCOUNTER — Other Ambulatory Visit: Payer: Self-pay | Admitting: Nurse Practitioner

## 2023-07-15 ENCOUNTER — Other Ambulatory Visit: Payer: Self-pay | Admitting: Family Medicine

## 2023-07-15 DIAGNOSIS — I10 Essential (primary) hypertension: Secondary | ICD-10-CM

## 2023-08-05 ENCOUNTER — Other Ambulatory Visit: Payer: Self-pay | Admitting: Nurse Practitioner

## 2023-08-05 DIAGNOSIS — E785 Hyperlipidemia, unspecified: Secondary | ICD-10-CM

## 2023-08-10 ENCOUNTER — Other Ambulatory Visit: Payer: Self-pay | Admitting: Family Medicine

## 2023-08-10 ENCOUNTER — Other Ambulatory Visit: Payer: Self-pay | Admitting: Nurse Practitioner

## 2023-08-10 DIAGNOSIS — I10 Essential (primary) hypertension: Secondary | ICD-10-CM

## 2023-08-10 DIAGNOSIS — F32A Depression, unspecified: Secondary | ICD-10-CM

## 2023-08-31 ENCOUNTER — Other Ambulatory Visit: Payer: Self-pay | Admitting: Family Medicine

## 2023-08-31 DIAGNOSIS — G40909 Epilepsy, unspecified, not intractable, without status epilepticus: Secondary | ICD-10-CM

## 2023-09-01 ENCOUNTER — Other Ambulatory Visit: Payer: Self-pay

## 2023-09-01 DIAGNOSIS — G40909 Epilepsy, unspecified, not intractable, without status epilepticus: Secondary | ICD-10-CM

## 2023-09-01 MED ORDER — LEVETIRACETAM 500 MG PO TABS
ORAL_TABLET | ORAL | 1 refills | Status: DC
Start: 2023-09-01 — End: 2023-12-10

## 2023-10-18 ENCOUNTER — Other Ambulatory Visit: Payer: Self-pay | Admitting: Family Medicine

## 2023-10-18 DIAGNOSIS — I10 Essential (primary) hypertension: Secondary | ICD-10-CM

## 2023-11-02 ENCOUNTER — Other Ambulatory Visit: Payer: Self-pay | Admitting: Nurse Practitioner

## 2023-11-02 ENCOUNTER — Other Ambulatory Visit: Payer: Self-pay | Admitting: Family Medicine

## 2023-11-02 DIAGNOSIS — I10 Essential (primary) hypertension: Secondary | ICD-10-CM

## 2023-11-02 DIAGNOSIS — F32A Depression, unspecified: Secondary | ICD-10-CM

## 2023-11-02 DIAGNOSIS — E785 Hyperlipidemia, unspecified: Secondary | ICD-10-CM

## 2023-11-04 ENCOUNTER — Other Ambulatory Visit: Payer: Self-pay | Admitting: Family Medicine

## 2023-11-04 DIAGNOSIS — E785 Hyperlipidemia, unspecified: Secondary | ICD-10-CM

## 2023-11-19 ENCOUNTER — Encounter (INDEPENDENT_AMBULATORY_CARE_PROVIDER_SITE_OTHER): Payer: Self-pay | Admitting: Gastroenterology

## 2023-12-06 ENCOUNTER — Other Ambulatory Visit: Payer: Self-pay | Admitting: Nurse Practitioner

## 2023-12-06 DIAGNOSIS — I10 Essential (primary) hypertension: Secondary | ICD-10-CM

## 2023-12-10 ENCOUNTER — Encounter: Payer: Self-pay | Admitting: Family Medicine

## 2023-12-10 ENCOUNTER — Ambulatory Visit: Payer: Self-pay | Admitting: Family Medicine

## 2023-12-10 DIAGNOSIS — F419 Anxiety disorder, unspecified: Secondary | ICD-10-CM

## 2023-12-10 DIAGNOSIS — E785 Hyperlipidemia, unspecified: Secondary | ICD-10-CM

## 2023-12-10 DIAGNOSIS — I10 Essential (primary) hypertension: Secondary | ICD-10-CM

## 2023-12-10 DIAGNOSIS — F32A Depression, unspecified: Secondary | ICD-10-CM

## 2023-12-10 DIAGNOSIS — G40909 Epilepsy, unspecified, not intractable, without status epilepticus: Secondary | ICD-10-CM

## 2023-12-10 MED ORDER — LEVETIRACETAM 500 MG PO TABS: ORAL_TABLET | ORAL | 1 refills | Status: AC

## 2023-12-10 MED ORDER — INDAPAMIDE 1.25 MG PO TABS: 1.2500 mg | ORAL_TABLET | Freq: Every day | ORAL | 1 refills | Status: AC

## 2023-12-10 MED ORDER — LOSARTAN POTASSIUM 50 MG PO TABS: 50.0000 mg | ORAL_TABLET | Freq: Every day | ORAL | 1 refills | Status: AC

## 2023-12-10 MED ORDER — ROSUVASTATIN CALCIUM 10 MG PO TABS: 10.0000 mg | ORAL_TABLET | Freq: Every day | ORAL | 1 refills | Status: AC

## 2023-12-10 MED ORDER — SERTRALINE HCL 100 MG PO TABS: 100.0000 mg | ORAL_TABLET | Freq: Every day | ORAL | 1 refills | Status: AC

## 2023-12-10 MED ORDER — LORAZEPAM 1 MG PO TABS: ORAL_TABLET | ORAL | 0 refills | Status: AC

## 2023-12-10 NOTE — Progress Notes (Signed)
 Subjective:    Patient ID: Valerie Elliott, female    DOB: 1965/05/14, 58 y.o.   MRN: [id number]  HPI Pt has no concerns that she can think of     12/10/2023    1:16 PM 11/21/2022    3:58 PM 11/09/2021    8:28 AM 06/16/2020    1:43 PM 03/06/2018    2:50 PM  Depression screen PHQ 2/9  Decreased Interest 1 2 0 0 1  Down, Depressed, Hopeless 2 1 0 1 1  PHQ - 2 Score 3 3 0 1 2  Altered sleeping 2 2  3 2   Tired, decreased energy 3 2  2 2   Change in appetite 2 1  3 1   Feeling bad or failure about yourself  1 1  3 2   Trouble concentrating 1 2  0 1  Moving slowly or fidgety/restless 1 1  1 2   Suicidal thoughts 1 0  0 0  PHQ-9 Score 14 12  13 12   Difficult doing work/chores Not difficult at all Not difficult at all  Not difficult at all Not difficult at all      12/10/2023    1:16 PM 11/21/2022    3:58 PM 06/16/2020    1:44 PM 11/25/2019   12:08 PM  GAD 7 : Generalized Anxiety Score  Nervous, Anxious, on Edge 2 1 2 3   Control/stop worrying 1 2 1    Worry too much - different things 1 1 1 2   Trouble relaxing 1 2 2 3   Restless 0 0 1 1  Easily annoyed or irritable 1 3 2 3   Afraid - awful might happen 0 2 2 3   Total GAD 7 Score 6 11 11    Anxiety Difficulty Not difficult at all Somewhat difficult Not difficult at all Very difficult   Patient has underlying history of hypertension obesity arthralgias as well as stress anxiety and depression but she feels she is managing that well currently she is trying to watch her diet trying to stay physically active 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 Essential hypertension Refill medication Lab work needs to be done but she states she  will have insurance provide January she will connect with us  and we will order the test - indapamide  (LOZOL ) 1.25 MG tablet; Take 1 tablet (1.25 mg total) by mouth daily.  Dispense: 90 tablet; Refill: 1 - losartan  (COZAAR ) 50 MG tablet; Take 1 tablet (50 mg total) by mouth daily. Appointment required  Dispense: 90 tablet; Refill: 1  2. Primary hypertension Ideally would like to see blood pressure 130/80 lab work by January - losartan  (COZAAR ) 50 MG tablet; Take 1 tablet (50 mg total) by mouth daily. Appointment required  Dispense: 90 tablet; Refill: 1  3. Anxiety and depression Continue sertraline  - sertraline  (ZOLOFT ) 100 MG tablet; Take 1 tablet (100 mg total) by mouth daily.  Dispense: 90 tablet; Refill: 1  4. Hyperlipidemia, unspecified hyperlipidemia type Continue cholesterol medicine check labs in January - rosuvastatin  (CRESTOR ) 10 MG tablet; Take 1 tablet (10 mg total) by mouth daily.  Dispense: 90 tablet; Refill: 1  5. Seizure disorder (HCC) No seizures recently continue medication -  levETIRAcetam  (KEPPRA ) 500 MG tablet; 1 twice daily as directed  Dispense: 180 tablet; Refill: 1  We did discuss colonoscopy she defers until she has insurance

## 2024-06-07 ENCOUNTER — Ambulatory Visit: Payer: Self-pay | Admitting: Family Medicine
